# Patient Record
Sex: Female | Born: 1955 | Race: White | Hispanic: No | Marital: Married | Smoking: Never smoker
Health system: Southern US, Community
[De-identification: ages and names within clinical notes are randomized; demographics above are authoritative.]

## PROBLEM LIST (undated history)

## (undated) DIAGNOSIS — I82409 Acute embolism and thrombosis of unspecified deep veins of unspecified lower extremity: Secondary | ICD-10-CM

## (undated) DIAGNOSIS — M5416 Radiculopathy, lumbar region: Secondary | ICD-10-CM

## (undated) DIAGNOSIS — L4050 Arthropathic psoriasis, unspecified: Secondary | ICD-10-CM

## (undated) DIAGNOSIS — G8929 Other chronic pain: Secondary | ICD-10-CM

## (undated) DIAGNOSIS — M549 Dorsalgia, unspecified: Secondary | ICD-10-CM

## (undated) DIAGNOSIS — E785 Hyperlipidemia, unspecified: Secondary | ICD-10-CM

## (undated) HISTORY — DX: Acute embolism and thrombosis of unspecified deep veins of unspecified lower extremity: I82.409

## (undated) HISTORY — PX: KNEE RECONSTRUCTION: SHX5883

## (undated) HISTORY — PX: TOTAL HIP ARTHROPLASTY: SHX124

## (undated) HISTORY — PX: OTHER SURGICAL HISTORY: SHX169

## (undated) HISTORY — DX: Hyperlipidemia, unspecified: E78.5

---

## 1984-10-27 HISTORY — PX: APPENDECTOMY: SHX54

## 1990-10-27 HISTORY — PX: CHOLECYSTECTOMY: SHX55

## 2000-08-27 ENCOUNTER — Other Ambulatory Visit: Admission: RE | Admit: 2000-08-27 | Discharge: 2000-08-27 | Payer: Self-pay | Admitting: Obstetrics and Gynecology

## 2000-10-06 ENCOUNTER — Encounter (INDEPENDENT_AMBULATORY_CARE_PROVIDER_SITE_OTHER): Payer: Self-pay | Admitting: Specialist

## 2000-10-06 ENCOUNTER — Ambulatory Visit (HOSPITAL_COMMUNITY): Admission: RE | Admit: 2000-10-06 | Discharge: 2000-10-06 | Payer: Self-pay | Admitting: Obstetrics and Gynecology

## 2000-11-19 ENCOUNTER — Encounter (INDEPENDENT_AMBULATORY_CARE_PROVIDER_SITE_OTHER): Payer: Self-pay | Admitting: Specialist

## 2000-11-19 ENCOUNTER — Other Ambulatory Visit: Admission: RE | Admit: 2000-11-19 | Discharge: 2000-11-19 | Payer: Self-pay | Admitting: Internal Medicine

## 2003-05-19 ENCOUNTER — Other Ambulatory Visit: Admission: RE | Admit: 2003-05-19 | Discharge: 2003-05-19 | Payer: Self-pay | Admitting: Obstetrics and Gynecology

## 2003-06-06 ENCOUNTER — Ambulatory Visit (HOSPITAL_COMMUNITY): Admission: RE | Admit: 2003-06-06 | Discharge: 2003-06-06 | Payer: Self-pay | Admitting: Obstetrics and Gynecology

## 2003-06-06 ENCOUNTER — Encounter: Payer: Self-pay | Admitting: Obstetrics and Gynecology

## 2007-01-22 ENCOUNTER — Ambulatory Visit (HOSPITAL_COMMUNITY): Admission: RE | Admit: 2007-01-22 | Discharge: 2007-01-22 | Payer: Self-pay | Admitting: Family Medicine

## 2007-05-05 ENCOUNTER — Encounter: Admission: RE | Admit: 2007-05-05 | Discharge: 2007-05-05 | Payer: Self-pay | Admitting: Family Medicine

## 2008-08-18 ENCOUNTER — Ambulatory Visit (HOSPITAL_COMMUNITY): Admission: RE | Admit: 2008-08-18 | Discharge: 2008-08-18 | Payer: Self-pay | Admitting: Family Medicine

## 2010-11-17 ENCOUNTER — Encounter: Payer: Self-pay | Admitting: Family Medicine

## 2011-03-14 NOTE — Op Note (Signed)
[facility]  Patient:    Shelby Clark, Shelby Clark                         MRN: [id number] Proc. Date: 10/06/00 Attending:  Juluis Mire, M.D.                           Operative Report  PREOPERATIVE DIAGNOSIS:       Endometrial polyp.  POSTOPERATIVE DIAGNOSIS:      Endometrial polyp.  OPERATION:                    1. Hysteroscopic resection of endometrial polyp.                               2. Dilatation and curettage.  SURGEON:                      Juluis Mire, M.D.  ANESTHESIA:                   Laryngeal mask anesthesia.  ESTIMATED BLOOD LOSS:         Normal.  PACKS & DRAINS:               None.  INTRAOPERATIVE BLOOD REPLACEMENT:  None.  COMPLICATIONS:                None.  INDICATIONS:                  In the dictated history and physical examination.  DESCRIPTION OF PROCEDURE:     The patient was taken to the operating room and placed in the supine position.  After a satisfactory level of general anesthesia was obtained, the patient was placed in the dorsal lithotomy position using Allen stirrups.  The abdomen, perineum, and vagina were prepped out with Betadine and draped as a sterile field for a hysteroscopy.  A speculum was placed in the vaginal vault.  The cervix was grasped with a single tooth tenaculum.  The uterus was sounded to approximately 8.0 cm.  The cervix was serially dilated to a size #35 Pratt dilator.  The operating hysteroscope was introduced.  Visualization revealed a large endometrial polyp coming from the fundal area.  This was resected in multiple pieces and sent for pathological review.  Further endometrial biopsies were obtained with the resectoscope.  At the end of the procedure we had good hemostasis.  No active bleeding.  The polyp had been completely transected.  We then obtained endometrial curettings.  A single tooth tenaculum was inspected and then removed.  The patient was taken out of the dorsal lithotomy  position.  Once alert and extubated, she was transferred to the recovery room in good condition.  The sponge, needle, and instrument counts were reported as correct by the circulating nurse x 2. DD:  10/06/00 TD:  10/06/00 Job: [id number]

## 2011-04-02 ENCOUNTER — Other Ambulatory Visit: Payer: Self-pay | Admitting: Family Medicine

## 2011-04-02 DIAGNOSIS — Z1231 Encounter for screening mammogram for malignant neoplasm of breast: Secondary | ICD-10-CM

## 2011-04-10 ENCOUNTER — Ambulatory Visit (HOSPITAL_COMMUNITY): Payer: Self-pay

## 2011-04-15 ENCOUNTER — Ambulatory Visit (HOSPITAL_COMMUNITY)
Admission: RE | Admit: 2011-04-15 | Discharge: 2011-04-15 | Disposition: A | Discharge status: Home / Self Care | Payer: BC Managed Care – PPO | Source: Ambulatory Visit | Attending: Family Medicine | Admitting: Family Medicine

## 2011-04-15 DIAGNOSIS — Z1231 Encounter for screening mammogram for malignant neoplasm of breast: Secondary | ICD-10-CM | POA: Insufficient documentation

## 2012-01-17 ENCOUNTER — Encounter: Payer: Self-pay | Admitting: Family Medicine

## 2012-01-17 ENCOUNTER — Ambulatory Visit: Payer: BC Managed Care – PPO

## 2012-01-17 ENCOUNTER — Ambulatory Visit (INDEPENDENT_AMBULATORY_CARE_PROVIDER_SITE_OTHER): Payer: BC Managed Care – PPO | Admitting: Family Medicine

## 2012-01-17 VITALS — BP 124/73 | HR 74 | Temp 98.2°F | Resp 16 | Ht 71.0 in | Wt 188.6 lb

## 2012-01-17 DIAGNOSIS — R1032 Left lower quadrant pain: Secondary | ICD-10-CM

## 2012-01-17 LAB — POCT CBC
Granulocyte percent: 53.2 %G (ref 37–80)
HCT, POC: 39.5 % (ref 37.7–47.9)
MID (cbc): 0.3 (ref 0–0.9)
MPV: 8.4 fL (ref 0–99.8)
POC Granulocyte: 2.3 (ref 2–6.9)
POC LYMPH PERCENT: 39.5 %L (ref 10–50)
POC MID %: 7.3 %M (ref 0–12)
Platelet Count, POC: 302 10*3/uL (ref 142–424)
RDW, POC: 12.9 %

## 2012-01-17 LAB — POCT URINALYSIS DIPSTICK
Bilirubin, UA: NEGATIVE
Ketones, UA: NEGATIVE
Nitrite, UA: NEGATIVE

## 2012-01-17 LAB — POCT UA - MICROSCOPIC ONLY

## 2012-01-17 MED ORDER — CIPROFLOXACIN HCL 500 MG PO TABS: 500.0000 mg | ORAL_TABLET | Freq: Two times a day (BID) | ORAL | Status: AC

## 2012-01-17 NOTE — Progress Notes (Signed)
  Subjective:    Patient ID: Shelby Clark, female    DOB: 1955/11/08, 56 y.o.   MRN: [id number]  HPI 56 yo female with lower abdominal pain.  Over a week ago had cough and fever and felt weak.  No vomitting or diarrhea.  Tuesday night started to have grabbing LLQ abdominal pain.  Worse with deep breath.  Felt much better with heating pad.  Felt better Thursday until the evening and started to return.  Last night started to grab again.  "in ovary area".  No further fever.  Bowels normal.  No dysuria or urinary frequency.  Comes and goes.  Nothing seems to make it worse.  Heat does make it better.  No unusual activity.  Had colonscopy 3/11   Review of Systems Negative except as per HPI     Objective:   Physical Exam  Constitutional: Vital signs are normal. She appears well-developed and well-nourished. She is active.  Cardiovascular: Normal rate, regular rhythm, normal heart sounds and normal pulses.   Pulmonary/Chest: Effort normal and breath sounds normal.  Abdominal: Soft. Normal appearance and bowel sounds are normal. She exhibits no distension and no mass. There is no hepatosplenomegaly. There is tenderness in the left lower quadrant. There is no rigidity, no rebound, no guarding, no CVA tenderness, no tenderness at McBurney's point and negative Murphy's sign. No hernia.  Neurological: She is alert.    Results for orders placed in visit on 01/17/12  POCT CBC      Component Value Range   WBC 4.4 (*) 4.6 - 10.2 (K/uL)   Lymph, poc 1.7  0.6 - 3.4    POC LYMPH PERCENT 39.5  10 - 50 (%L)   MID (cbc) 0.3  0 - 0.9    POC MID % 7.3  0 - 12 (%M)   POC Granulocyte 2.3  2 - 6.9    Granulocyte percent 53.2  37 - 80 (%G)   RBC 4.44  4.04 - 5.48 (M/uL)   Hemoglobin 12.6  12.2 - 16.2 (g/dL)   HCT, POC 40.9  81.1 - 47.9 (%)   MCV 89.0  80 - 97 (fL)   MCH, POC 28.4  27 - 31.2 (pg)   MCHC 31.9  31.8 - 35.4 (g/dL)   RDW, POC 91.4     Platelet Count, POC 302  142 - 424 (K/uL)   MPV 8.4  0 -  99.8 (fL)  POCT UA - MICROSCOPIC ONLY      Component Value Range   WBC, Ur, HPF, POC 7-18     RBC, urine, microscopic 2-3     Bacteria, U Microscopic 1+     Mucus, UA neg     Epithelial cells, urine per micros 2-6     Crystals, Ur, HPF, POC neg     Casts, Ur, LPF, POC neg     Yeast, UA neg     Moderate LE as well  [facility] Primary radiology reading by Dr. Georgiana Shore: NAD    Assessment & Plan:  LLQ abdominal pain - suspicious urine.  Possible UTI.  Start cipro.  Await culture.  Also schedule pelvic ulrasound for further eval

## 2012-01-18 LAB — URINE CULTURE: Colony Count: 10000

## 2012-01-19 ENCOUNTER — Other Ambulatory Visit: Payer: Self-pay | Admitting: Family Medicine

## 2012-01-19 DIAGNOSIS — R1032 Left lower quadrant pain: Secondary | ICD-10-CM

## 2012-05-25 ENCOUNTER — Other Ambulatory Visit: Payer: Self-pay | Admitting: Family Medicine

## 2012-05-25 DIAGNOSIS — Z139 Encounter for screening, unspecified: Secondary | ICD-10-CM

## 2012-06-03 ENCOUNTER — Ambulatory Visit (HOSPITAL_COMMUNITY)
Admission: RE | Admit: 2012-06-03 | Discharge: 2012-06-03 | Disposition: A | Discharge status: Home / Self Care | Payer: BC Managed Care – PPO | Source: Ambulatory Visit | Attending: Family Medicine | Admitting: Family Medicine

## 2012-06-03 DIAGNOSIS — Z1231 Encounter for screening mammogram for malignant neoplasm of breast: Secondary | ICD-10-CM | POA: Insufficient documentation

## 2012-06-03 DIAGNOSIS — Z139 Encounter for screening, unspecified: Secondary | ICD-10-CM

## 2012-06-08 ENCOUNTER — Ambulatory Visit (HOSPITAL_COMMUNITY): Payer: BC Managed Care – PPO

## 2013-03-28 ENCOUNTER — Encounter: Payer: Self-pay | Admitting: Physician Assistant

## 2013-03-28 ENCOUNTER — Ambulatory Visit (INDEPENDENT_AMBULATORY_CARE_PROVIDER_SITE_OTHER): Payer: BC Managed Care – PPO | Admitting: Physician Assistant

## 2013-03-28 VITALS — BP 140/90 | HR 108 | Temp 98.4°F | Resp 20 | Ht 70.5 in | Wt 186.0 lb

## 2013-03-28 DIAGNOSIS — B9689 Other specified bacterial agents as the cause of diseases classified elsewhere: Secondary | ICD-10-CM

## 2013-03-28 DIAGNOSIS — J988 Other specified respiratory disorders: Secondary | ICD-10-CM

## 2013-03-28 DIAGNOSIS — A499 Bacterial infection, unspecified: Secondary | ICD-10-CM

## 2013-03-28 MED ORDER — AZITHROMYCIN 250 MG PO TABS: ORAL_TABLET | ORAL | Status: DC

## 2013-03-28 MED ORDER — BENZONATATE 200 MG PO CAPS: 200.0000 mg | ORAL_CAPSULE | Freq: Two times a day (BID) | ORAL | Status: DC | PRN

## 2013-03-28 NOTE — Progress Notes (Signed)
   Patient ID: Shelby Clark MRN: [id number], DOB: May 05, 1956, 57 y.o. Date of Encounter: 03/28/2013, 10:50 AM    Chief Complaint:  Chief Complaint  Patient presents with  . congestion, cough, sinus pain, fever     HPI: 57 y.o. year old female reports that she started getting sick 6 days ago. Started with head, nose congetsion, pressure. Now with really bad cough. Had low grade fever. Was in bed all weekend.   Home Meds: See attached medication section for any medications that were entered at today's visit. The computer does not put those onto this list.The following list is a list of meds entered prior to today's visit.   No current outpatient prescriptions on file prior to visit.   No current facility-administered medications on file prior to visit.    Allergies: No Known Allergies    Review of Systems: See HPI for pertinent ROS. All other ROS negative.    Physical Exam: Blood pressure 140/90, pulse 108, temperature 98.4 F (36.9 C), temperature source Oral, resp. rate 20, height 5' 10.5" (1.791 m), weight 186 lb (84.369 kg)., Body mass index is 26.3 kg/(m^2). General: WNWD WF. Appears in no acute distress. HEENT: Normocephalic, atraumatic, eyes without discharge, sclera non-icteric, nares are without discharge. Bilateral auditory canals clear, TM's are without perforation, pearly grey and translucent with reflective cone of light bilaterally. Oral cavity moist, posterior pharynx without exudate, erythema, peritonsillar abscess, or post nasal drip.No sinus tenderness with percussion.  Neck: Supple. No thyromegaly. No lymphadenopathy. Lungs: Clear bilaterally to auscultation without wheezes, rales, or rhonchi. Breathing is unlabored. Heart: Regular rhythm. No murmurs, rubs, or gallops. Msk:  Strength and tone normal for age. Neuro: Alert and oriented X 3. Moves all extremities spontaneously. Gait is normal. CNII-XII grossly in tact. Psych:  Responds to questions appropriately with  a normal affect.     ASSESSMENT AND PLAN:  57 y.o. year old female with  1. Bacterial respiratory infection - azithromycin (ZITHROMAX) 250 MG tablet; Day 1: Take 2 daily.  Days 2-5: Take 1 daily.  Dispense: 6 tablet; Refill: 0 - benzonatate (TESSALON) 200 MG capsule; Take 1 capsule (200 mg total) by mouth 2 (two) times daily as needed for cough.  Dispense: 20 capsule; Refill: 0 F/U if symtoms do not resolve.   Signed, [address] [place], [place], [facility] 03/28/2013 10:50 AM

## 2013-04-04 ENCOUNTER — Telehealth: Payer: Self-pay | Admitting: Family Medicine

## 2013-04-04 MED ORDER — LEVOFLOXACIN 750 MG PO TABS: 750.0000 mg | ORAL_TABLET | Freq: Every day | ORAL | Status: DC

## 2013-04-04 NOTE — Telephone Encounter (Signed)
rx sent to pharmacy.  Left mess at pt home

## 2013-04-04 NOTE — Telephone Encounter (Signed)
Tell her we will treat with stronger antibiotic. If symptoms do not resolve after this, then she needs to come back for another OV eval. Rx: Levaquin 750mg  one po QD x 7 days.  # 7 /0 .

## 2013-06-08 ENCOUNTER — Encounter: Payer: Self-pay | Admitting: Family Medicine

## 2013-06-08 DIAGNOSIS — E785 Hyperlipidemia, unspecified: Secondary | ICD-10-CM | POA: Insufficient documentation

## 2013-06-09 ENCOUNTER — Other Ambulatory Visit: Payer: Self-pay | Admitting: Physician Assistant

## 2013-06-09 ENCOUNTER — Encounter: Payer: Self-pay | Admitting: Physician Assistant

## 2013-06-09 ENCOUNTER — Ambulatory Visit (INDEPENDENT_AMBULATORY_CARE_PROVIDER_SITE_OTHER): Payer: BC Managed Care – PPO | Admitting: Physician Assistant

## 2013-06-09 VITALS — BP 114/80 | HR 68 | Temp 98.4°F | Resp 18 | Ht 70.0 in | Wt 187.0 lb

## 2013-06-09 DIAGNOSIS — N760 Acute vaginitis: Secondary | ICD-10-CM

## 2013-06-09 DIAGNOSIS — B9689 Other specified bacterial agents as the cause of diseases classified elsewhere: Secondary | ICD-10-CM

## 2013-06-09 DIAGNOSIS — E785 Hyperlipidemia, unspecified: Secondary | ICD-10-CM

## 2013-06-09 DIAGNOSIS — A499 Bacterial infection, unspecified: Secondary | ICD-10-CM

## 2013-06-09 LAB — WET PREP FOR TRICH, YEAST, CLUE: Yeast Wet Prep HPF POC: NONE SEEN

## 2013-06-09 MED ORDER — METRONIDAZOLE 500 MG PO TABS: 500.0000 mg | ORAL_TABLET | Freq: Two times a day (BID) | ORAL | Status: DC

## 2013-06-09 NOTE — Progress Notes (Signed)
   Patient ID: Shelby Clark MRN: [id number], DOB: 1955/12/02, 57 y.o. Date of Encounter: 06/09/2013, 1:28 PM    Chief Complaint:  Chief Complaint  Patient presents with  . Vaginitis     HPI: 57 y.o. year old white female presents with complaints of vaginal odor. She has not noticed any on discharge or itching or irritation. She is married in a monogamous relationship.     Home Meds: See attached medication section for any medications that were entered at today's visit. The computer does not put those onto this list.The following list is a list of meds entered prior to today's visit.   No current outpatient prescriptions on file prior to visit.   No current facility-administered medications on file prior to visit.    Allergies: No Known Allergies    Review of Systems: See HPI for pertinent ROS. All other ROS negative.    Physical Exam: Blood pressure 114/80, pulse 68, temperature 98.4 F (36.9 C), temperature source Oral, resp. rate 18, height 5\' 10"  (1.778 m), weight 187 lb (84.823 kg)., Body mass index is 26.83 kg/(m^2). General: Well-nourished well-developed white female. Appears in no acute distress. Lungs: Clear bilaterally to auscultation without wheezes, rales, or rhonchi. Breathing is unlabored. Heart: Regular rhythm. No murmurs, rubs, or gallops. Abdomen: Soft, non-tender, non-distended with normoactive bowel sounds. No hepatomegaly. No rebound/guarding. No obvious abdominal masses. Msk:  Strength and tone normal for age. Pelvic exam external genitalia normal. Vaginal mucosa normal. Cervix normal. There is a very small amount of vaginal discharge present. Bimanual exam is normal with no cervical motion tenderness no mass no tenderness. Neuro: Alert and oriented X 3. Moves all extremities spontaneously. Gait is normal. CNII-XII grossly in tact. Psych:  Responds to questions appropriately with a normal affect.    Results for orders placed in visit on 06/09/13  WET  PREP FOR TRICH, YEAST, CLUE      Result Value Range   Yeast Wet Prep HPF POC NONE SEEN  NONE SEEN   Trich, Wet Prep NONE SEEN  NONE SEEN   Clue Cells Wet Prep HPF POC FEW (*) NONE SEEN   WBC, Wet Prep HPF POC FEW  NONE SEEN     ASSESSMENT AND PLAN:  57 y.o. year old female with  1. Bacterial vaginosis Patient states that she has no history of BV in the past. I discussed the cause of BV. She does not sit in bath tub and does not douche. - metroNIDAZOLE (FLAGYL) 500 MG tablet; Take 1 tablet (500 mg total) by mouth 2 (two) times daily.  Dispense: 14 tablet; Refill: 0  2. Vaginitis and vulvovaginitis - WET PREP FOR TRICH, YEAST, CLUE - GC/chlamydia probe amp, genital    Signed, [address] [place], [place], [facility] 06/09/2013 1:28 PM

## 2013-06-10 LAB — GC/CHLAMYDIA PROBE AMP: CT Probe RNA: NEGATIVE

## 2014-06-28 ENCOUNTER — Other Ambulatory Visit: Payer: Self-pay | Admitting: Chiropractic Medicine

## 2014-06-28 DIAGNOSIS — M25562 Pain in left knee: Secondary | ICD-10-CM

## 2014-07-06 ENCOUNTER — Other Ambulatory Visit: Payer: BC Managed Care – PPO

## 2014-10-25 ENCOUNTER — Encounter: Payer: Self-pay | Admitting: Family Medicine

## 2014-10-25 ENCOUNTER — Ambulatory Visit (INDEPENDENT_AMBULATORY_CARE_PROVIDER_SITE_OTHER): Payer: BC Managed Care – PPO | Admitting: Family Medicine

## 2014-10-25 VITALS — BP 110/58 | HR 64 | Temp 98.1°F | Resp 16 | Ht 70.0 in | Wt 197.0 lb

## 2014-10-25 DIAGNOSIS — L820 Inflamed seborrheic keratosis: Secondary | ICD-10-CM

## 2014-10-25 DIAGNOSIS — J309 Allergic rhinitis, unspecified: Secondary | ICD-10-CM

## 2014-10-25 MED ORDER — FLUTICASONE PROPIONATE 50 MCG/ACT NA SUSP: 2.0000 | Freq: Every day | NASAL | Status: DC

## 2014-10-25 MED ORDER — DESLORATADINE 5 MG PO TABS: 5.0000 mg | ORAL_TABLET | Freq: Every day | ORAL | Status: DC

## 2014-10-25 NOTE — Addendum Note (Signed)
Addended by: Milinda AntisURHAM, KAWANTA F on: 10/25/2014 04:41 PM   Modules accepted: Orders

## 2014-10-25 NOTE — Progress Notes (Signed)
Patient ID: Shelby Clark, female   DOB: [birth year], 58 y.o.   MRN: [id number]   Subjective:    Patient ID: Shelby LiasSylvia C Clark, female    DOB: [birth year], 58 y.o.   MRN: [id number]  Patient presents for Illness and Mole  here with episodes of sinus drainage postnasal drip and congestion for the past few months. She's tried an over-the-counter allergy medicine with minimal improvement. Occasionally when she rolls to the left side she gets a dizzy sensation only last a couple of seconds and then resolves. She does not take any medication besides her hormone pills on a regular basis. She's not had any cough sore throat fever. Her main concern today is an irritated mole under her left bra strap she noticed that it has grown in size and that it is itching she did peel off part of it and it has regrown. She has had some other moles removed due to history of sun damage but they were benign.    Review Of Systems:  GEN- denies fatigue, fever, weight loss,weakness, recent illness HEENT- denies eye drainage, change in vision, nasal discharge, CVS- denies chest pain, palpitations RESP- denies SOB, cough, wheeze ABD- denies N/V, change in stools, abd pain GU- denies dysuria, hematuria, dribbling, incontinence MSK- denies joint pain, muscle aches, injury Neuro- denies headache, dizziness, syncope, seizure activity       Objective:    BP 110/58 mmHg  Pulse 64  Temp(Src) 98.1 F (36.7 C) (Oral)  Resp 16  Ht 5\' 10"  (1.778 m)  Wt 197 lb (89.359 kg)  BMI 28.27 kg/m2 GEN- NAD, alert and oriented x3 HEENT- PERRL, EOMI, non injected sclera, pink conjunctiva, MMM, oropharynx -post nasal drip, enlarged turbinates, clear rhinorrhea, no maxillary sinus tenderness, TM clear no effusion Neck- Supple, no LAD CVS- RRR, no murmur RESP-CTAB Skin- left lower thoracic beneath bra strap- inflammed keratosis hyperpigmentation on right half  Procedure- Shave biopsy Procedure explained to patient questions answered  benefits and risks discussed verbal consent obtained. Antiseptic-Betadine Anesthesia-lidocaine 1% with Epi Lesion removed left lower thoraic with razor blade Minimal blood loss, drysol touched to lesion for hemostasis Patient tolerated procedure well Bandage applied         Assessment & Plan:      Problem List Items Addressed This Visit    None    Visit Diagnoses    Keratosis, inflamed seborrheic    -  Primary    s/p removal send for pathology    Allergic rhinitis, unspecified allergic rhinitis type        trial of clarinex and flonase as needed, no sign of infection, note hyperliidemia seen in chart, pt states she gets this checked with GYN. Declines FLu shot       Note: This dictation was prepared with Dragon dictation along with smaller phrase technology. Any transcriptional errors that result from this process are unintentional.

## 2014-10-25 NOTE — Patient Instructions (Signed)
Start allergy medication as prescribed We will call with biopsy results F/U as needed, I recommend you return for cholesterol check  Biopsy Care After These instructions give you information on caring for yourself after your procedure. Your doctor may also give you more specific instructions. Call your doctor if you have any problems or questions after your procedure. HOME CARE   Return to your normal diet and activities as told by your doctor.  Change your bandages (dressings) as told by your doctor. If skin glue (adhesive) was used, it will peel off in 7 days.  Only take medicines as told by your doctor.  Ask your doctor when you can bathe and get your wound wet. GET HELP RIGHT AWAY IF:  You see more than a small spot of blood coming from the wound.  You have redness, puffiness (swelling), or pain.  You see yellowish-white fluid (pus) coming from the wound.  You have a fever.  You notice a bad smell coming from the wound or bandage.  You have a rash, trouble breathing, or any allergy problems. MAKE SURE YOU:   Understand these instructions.  Will watch your condition.  Will get help right away if you are not doing well or get worse. Document Released: 06/17/2011 Document Revised: 01/05/2012 Document Reviewed: 06/17/2011 [facility] Patient Information 2015 [employer] [place]. This information is not intended to replace advice given to you by your health care provider. Make sure you discuss any questions you have with your health care provider.

## 2014-10-30 LAB — PATHOLOGY

## 2014-11-01 ENCOUNTER — Encounter: Payer: Self-pay | Admitting: *Deleted

## 2015-04-13 ENCOUNTER — Ambulatory Visit (INDEPENDENT_AMBULATORY_CARE_PROVIDER_SITE_OTHER): Payer: BC Managed Care – PPO | Admitting: Family Medicine

## 2015-04-13 ENCOUNTER — Ambulatory Visit (HOSPITAL_COMMUNITY)
Admission: RE | Admit: 2015-04-13 | Discharge: 2015-04-13 | Disposition: A | Discharge status: Home / Self Care | Payer: BC Managed Care – PPO | Source: Ambulatory Visit | Attending: Family Medicine | Admitting: Family Medicine

## 2015-04-13 ENCOUNTER — Other Ambulatory Visit: Payer: Self-pay | Admitting: *Deleted

## 2015-04-13 ENCOUNTER — Encounter: Payer: Self-pay | Admitting: Family Medicine

## 2015-04-13 VITALS — BP 128/68 | HR 76 | Temp 98.9°F | Resp 14 | Ht 70.0 in | Wt 196.0 lb

## 2015-04-13 DIAGNOSIS — M79671 Pain in right foot: Secondary | ICD-10-CM

## 2015-04-13 DIAGNOSIS — B070 Plantar wart: Secondary | ICD-10-CM

## 2015-04-13 DIAGNOSIS — S99921D Unspecified injury of right foot, subsequent encounter: Secondary | ICD-10-CM

## 2015-04-13 DIAGNOSIS — Y9368 Activity, volleyball (beach) (court): Secondary | ICD-10-CM | POA: Diagnosis not present

## 2015-04-13 DIAGNOSIS — S99921A Unspecified injury of right foot, initial encounter: Secondary | ICD-10-CM | POA: Diagnosis not present

## 2015-04-13 NOTE — Progress Notes (Signed)
Patient ID: Shelby Clark, female   DOB: 03-22-1956, 59 y.o.   MRN: [id number]   Subjective:    Patient ID: Shelby Clark, female    DOB: February 09, 1956, 58 y.o.   MRN: [id number]  Patient presents for R Foot Pain  patient here with right foot pain. She was playing volleyball barefooted and was coming down hard on her foot for the past 2 weeks she has had some soreness on the bottom of her foot. She is concerned about fracture. She has not had any significant bruising but has had swelling and some point tenderness on her foot. She also noticed a plantar wart there about a week ago that she would like to have frozen off. She denies any ankle pain or knee pain.    Review Of Systems:  GEN- denies fatigue, fever, weight loss,weakness, recent illness HEENT- denies eye drainage, change in vision, nasal discharge, CVS- denies chest pain, palpitations RESP- denies SOB, cough, wheeze ABD- denies N/V, change in stools, abd pain GU- denies dysuria, hematuria, dribbling, incontinence MSK- + joint pain, muscle aches, injury Neuro- denies headache, dizziness, syncope, seizure activity       Objective:    BP 128/68 mmHg  Pulse 76  Temp(Src) 98.9 F (37.2 C) (Oral)  Resp 14  Ht 5\' 10"  (1.778 m)  Wt 196 lb (88.905 kg)  BMI 28.12 kg/m2 GEN- NAD, alert and oriented x3 MSK- Right foot- bunion of great toe, FROM ankle, neg sqeeze test, sole of foot/lateral edge- TTP with swelling approx 2-3cm area, no bony abnormality Skin- small plantar wart beneath area of swelling EXT- No edema Pulses- DP- 2+  Cryotherapy - Plantar wart  Procedure reviewed, questions anwered verbal consent given Cryotherapy 5 sec x 3 passes Pt tolerated procedure well      Assessment & Plan:      Problem List Items Addressed This Visit    None    Visit Diagnoses    Plantar wart    -  Primary    Cryotherapy done    Foot injury, right, subsequent encounter        Xray- no acute fracture, chronic bunion, no stress  fracture, / deep bruising with teh swelling,place in walking boot- pt has one at home x 1 week, NSAIDS, elevate foot       Note: This dictation was prepared with Dragon dictation along with smaller phrase technology. Any transcriptional errors that result from this process are unintentional.

## 2015-04-13 NOTE — Patient Instructions (Signed)
I will call with lab results  Wear walking boot  F/U as needed

## 2015-04-13 NOTE — Addendum Note (Signed)
Addended by: Milinda Antis F on: 04/13/2015 05:35 PM   Modules accepted: Level of Service

## 2015-11-26 ENCOUNTER — Encounter: Payer: Self-pay | Admitting: Family Medicine

## 2015-11-26 ENCOUNTER — Ambulatory Visit (INDEPENDENT_AMBULATORY_CARE_PROVIDER_SITE_OTHER): Payer: BC Managed Care – PPO | Admitting: Family Medicine

## 2015-11-26 VITALS — BP 126/84 | HR 78 | Temp 99.0°F | Resp 16 | Wt 190.0 lb

## 2015-11-26 DIAGNOSIS — J111 Influenza due to unidentified influenza virus with other respiratory manifestations: Secondary | ICD-10-CM

## 2015-11-26 NOTE — Progress Notes (Signed)
   Subjective:    Patient ID: Shelby Clark, female    DOB: 1956-02-15, 60 y.o.   MRN: [id number]  HPI Symptomsbegan last week on Tuesday. Symptoms consist of high fever, diffuse body aches, rhinorrhea, sore throat secondary to postnasal drip, and a persistent cough. Symptoms are approximately 50% better. She is no longer running a fever. She would like to return to work. However she needs a note for work explaining her absence. Past Medical History  Diagnosis Date  . Hyperlipidemia    Past Surgical History  Procedure Laterality Date  . Abdominal laparoscopy    . Appendectomy  1986  . Cholecystectomy  1992   Current Outpatient Prescriptions on File Prior to Visit  Medication Sig Dispense Refill  . desloratadine (CLARINEX) 5 MG tablet Take 1 tablet (5 mg total) by mouth daily. 30 tablet 6  . Estradiol (ESTRACE VA) Place vaginally.    . fluticasone (FLONASE) 50 MCG/ACT nasal spray Place 2 sprays into both nostrils daily. 16 g 6   No current facility-administered medications on file prior to visit.   No Known Allergies Social History   Social History  . Marital Status: Married    Spouse Name: N/A  . Number of Children: N/A  . Years of Education: N/A   Occupational History  . Not on file.   Social History Main Topics  . Smoking status: Never Smoker   . Smokeless tobacco: Never Used  . Alcohol Use: No  . Drug Use: No  . Sexual Activity: Not on file   Other Topics Concern  . Not on file   Social History Narrative      Review of Systems  All other systems reviewed and are negative.      Objective:   Physical Exam  Constitutional: She appears well-developed and well-nourished.  HENT:  Right Ear: External ear normal.  Left Ear: External ear normal.  Nose: Nose normal.  Mouth/Throat: Oropharynx is clear and moist. No oropharyngeal exudate.  Eyes: Conjunctivae are normal.  Neck: Neck supple.  Cardiovascular: Normal rate, regular rhythm and normal heart sounds.     Pulmonary/Chest: Effort normal and breath sounds normal. No respiratory distress. She has no wheezes. She has no rales.  Abdominal: Soft. Bowel sounds are normal. She exhibits no distension. There is no tenderness. There is no rebound.  Lymphadenopathy:    She has no cervical adenopathy.  Vitals reviewed.         Assessment & Plan:  Influenza with respiratory manifestation  Clinically, the patient had influenza. Symptoms have persisted now for 7 days. I believe she can go back to work on Wednesday. I will continue to treat her symptoms symptomatically with Tylenol for fever, Mucinex DM for cough. She can use Hycodan 1 teaspoon every 8 hours as needed for severe cough. She is cleared to return to work on Wednesday she should no longer be contagious.

## 2015-11-27 ENCOUNTER — Encounter: Payer: Self-pay | Admitting: Family Medicine

## 2015-11-29 ENCOUNTER — Telehealth: Payer: Self-pay | Admitting: Family Medicine

## 2015-11-29 ENCOUNTER — Other Ambulatory Visit: Payer: Self-pay | Admitting: Family Medicine

## 2015-11-29 DIAGNOSIS — R059 Cough, unspecified: Secondary | ICD-10-CM

## 2015-11-29 DIAGNOSIS — R05 Cough: Secondary | ICD-10-CM

## 2015-11-29 MED ORDER — AZITHROMYCIN 250 MG PO TABS: ORAL_TABLET | ORAL | Status: DC

## 2015-11-29 NOTE — Telephone Encounter (Signed)
Pt made aware of antibiotic.  Is going to go to [facility] for CXR.  Orders in

## 2015-11-29 NOTE — Telephone Encounter (Signed)
I am sorry, add zpack and obtain cxr

## 2015-11-29 NOTE — Telephone Encounter (Signed)
Still out of work sick.  Cough has not improved at all.  Hycodan has not helped at all.  Has had no sleep.  Please advise?

## 2015-11-30 ENCOUNTER — Encounter: Payer: Self-pay | Admitting: Family Medicine

## 2015-12-14 ENCOUNTER — Telehealth: Payer: Self-pay | Admitting: Family Medicine

## 2015-12-14 NOTE — Telephone Encounter (Signed)
Ok with note for today but if no better by next week needs to be seen.

## 2015-12-14 NOTE — Telephone Encounter (Signed)
PATIENT CALLING TO SAY THAT SHE HAD GONE BACK TO SCHOOL AFTER BEING OUT WITH THE FLU, SHE HAS NOW RELAPSED WITH HER ILLNESS, WOULD LIKE TO KNOW IF SHE CAN HAVE AN ADDITIONAL NOTE OR TALK TO YOU REGARDING THIS  [phone]  FAX NUMBER IF WE ARE FAXING NOTE IS [phone]

## 2015-12-14 NOTE — Telephone Encounter (Signed)
Pt did not get cxr

## 2015-12-14 NOTE — Telephone Encounter (Signed)
Spoke to pt and is aware if no better NTBS and note faxed as requested

## 2016-07-17 ENCOUNTER — Other Ambulatory Visit: Payer: Self-pay | Admitting: Family Medicine

## 2016-07-17 DIAGNOSIS — Z1231 Encounter for screening mammogram for malignant neoplasm of breast: Secondary | ICD-10-CM

## 2016-07-25 ENCOUNTER — Other Ambulatory Visit: Payer: BC Managed Care – PPO

## 2016-07-25 DIAGNOSIS — Z79890 Hormone replacement therapy (postmenopausal): Secondary | ICD-10-CM

## 2016-07-25 DIAGNOSIS — Z79899 Other long term (current) drug therapy: Secondary | ICD-10-CM

## 2016-07-25 DIAGNOSIS — Z0000 Encounter for general adult medical examination without abnormal findings: Secondary | ICD-10-CM

## 2016-07-25 DIAGNOSIS — E785 Hyperlipidemia, unspecified: Secondary | ICD-10-CM

## 2016-07-25 LAB — COMPLETE METABOLIC PANEL WITH GFR
ALT: 16 U/L (ref 6–29)
AST: 19 U/L (ref 10–35)
Albumin: 4.1 g/dL (ref 3.6–5.1)
Alkaline Phosphatase: 59 U/L (ref 33–130)
BUN: 13 mg/dL (ref 7–25)
CALCIUM: 9.1 mg/dL (ref 8.6–10.4)
CHLORIDE: 107 mmol/L (ref 98–110)
CO2: 31 mmol/L (ref 20–31)
CREATININE: 0.82 mg/dL (ref 0.50–1.05)
GFR, Est African American: >89 mL/min (ref >=60)
GFR, Est Non African American: 79 mL/min (ref >=60)
Glucose, Bld: 87 mg/dL (ref 70–99)
Potassium: 4.6 mmol/L (ref 3.5–5.3)
Sodium: 143 mmol/L (ref 135–146)
TOTAL PROTEIN: 6.7 g/dL (ref 6.1–8.1)
Total Bilirubin: 0.4 mg/dL (ref 0.2–1.2)

## 2016-07-25 LAB — CBC WITH DIFFERENTIAL/PLATELET
BASOS PCT: 1 %
Basophils Absolute: 42 cells/uL (ref 0–200)
EOS PCT: 4 %
Eosinophils Absolute: 168 cells/uL (ref 15–500)
HCT: 41.9 % (ref 35.0–45.0)
Hemoglobin: 14 g/dL (ref 12.0–15.0)
LYMPHS PCT: 29 %
Lymphs Abs: 1218 cells/uL (ref 850–3900)
MCH: 30.2 pg (ref 27.0–33.0)
MCHC: 33.4 g/dL (ref 32.0–36.0)
MCV: 90.5 fL (ref 80.0–100.0)
MONOS PCT: 10 %
MPV: 9.6 fL (ref 7.5–12.5)
Monocytes Absolute: 420 cells/uL (ref 200–950)
Neutro Abs: 2352 cells/uL (ref 1500–7800)
Neutrophils Relative %: 56 %
PLATELETS: 310 10*3/uL (ref 140–400)
RBC: 4.63 MIL/uL (ref 3.80–5.10)
RDW: 13.1 % (ref 11.0–15.0)
WBC: 4.2 10*3/uL (ref 3.8–10.8)

## 2016-07-25 LAB — LIPID PANEL
CHOL/HDL RATIO: 4.6 ratio (ref <=5.0)
Cholesterol: 204 mg/dL — ABNORMAL HIGH (ref 125–200)
HDL: 44 mg/dL — ABNORMAL LOW (ref >=46)
LDL Cholesterol: 138 mg/dL — ABNORMAL HIGH (ref <130)
Triglycerides: 110 mg/dL (ref <150)
VLDL: 22 mg/dL (ref <30)

## 2016-07-25 LAB — TSH: TSH: 1.05 mIU/L

## 2016-07-26 LAB — VITAMIN D 25 HYDROXY (VIT D DEFICIENCY, FRACTURES): Vit D, 25-Hydroxy: 25 ng/mL — ABNORMAL LOW (ref 30–100)

## 2016-07-28 ENCOUNTER — Ambulatory Visit (HOSPITAL_COMMUNITY)
Admission: RE | Admit: 2016-07-28 | Discharge: 2016-07-28 | Disposition: A | Discharge status: Home / Self Care | Payer: BC Managed Care – PPO | Source: Ambulatory Visit | Attending: Family Medicine | Admitting: Family Medicine

## 2016-07-28 DIAGNOSIS — R928 Other abnormal and inconclusive findings on diagnostic imaging of breast: Secondary | ICD-10-CM | POA: Diagnosis not present

## 2016-07-28 DIAGNOSIS — Z1231 Encounter for screening mammogram for malignant neoplasm of breast: Secondary | ICD-10-CM | POA: Diagnosis not present

## 2016-08-04 ENCOUNTER — Other Ambulatory Visit: Payer: Self-pay | Admitting: Family Medicine

## 2016-08-04 DIAGNOSIS — R928 Other abnormal and inconclusive findings on diagnostic imaging of breast: Secondary | ICD-10-CM

## 2016-08-18 ENCOUNTER — Encounter: Payer: Self-pay | Admitting: Family Medicine

## 2016-08-18 ENCOUNTER — Ambulatory Visit (INDEPENDENT_AMBULATORY_CARE_PROVIDER_SITE_OTHER): Payer: BC Managed Care – PPO | Admitting: Family Medicine

## 2016-08-18 VITALS — BP 124/62 | HR 76 | Temp 98.7°F | Resp 14 | Ht 70.0 in | Wt 195.0 lb

## 2016-08-18 DIAGNOSIS — Z124 Encounter for screening for malignant neoplasm of cervix: Secondary | ICD-10-CM | POA: Diagnosis not present

## 2016-08-18 DIAGNOSIS — Z23 Encounter for immunization: Secondary | ICD-10-CM | POA: Diagnosis not present

## 2016-08-18 DIAGNOSIS — Z0000 Encounter for general adult medical examination without abnormal findings: Secondary | ICD-10-CM

## 2016-08-18 NOTE — Addendum Note (Signed)
Addended by: Phillips OdorSIX, CHRISTINA H on: 08/18/2016 05:02 PM   Modules accepted: Orders

## 2016-08-18 NOTE — Progress Notes (Signed)
   Subjective:    Patient ID: Shelby Clark, female    DOB: [birth year], 60 y.o.   MRN: [id number]  Patient presents for CPE with PAP (is not fasting)  Patient here for complete physical exam. Colonoscopy up-to-date. Mammogram done but she has a benign lesion in her left breast that has been palpated for the past 15 years that she has a follow-up ultrasound to be done tomorrow. She is due for Pap smear she's never had abnormal Pap smear. She is postmenopausal. The past she was given Estrace vaginally to help with itching in odor she's not been on this medication greater than one year.  Immunizations she declines flu shot she is due for tetanus booster.  She is not on any current medications.  No current concerns. She does have history of tonsil stones she is not one anything done with these at this time.   Review Of Systems:  GEN- denies fatigue, fever, weight loss,weakness, recent illness HEENT- denies eye drainage, change in vision, nasal discharge, CVS- denies chest pain, palpitations RESP- denies SOB, cough, wheeze ABD- denies N/V, change in stools, abd pain GU- denies dysuria, hematuria, dribbling, incontinence MSK- denies joint pain, muscle aches, injury Neuro- denies headache, dizziness, syncope, seizure activity       Objective:    BP 124/62 (BP Location: Left Arm, Patient Position: Sitting, Cuff Size: Normal)   Pulse 76   Temp 98.7 F (37.1 C) (Oral)   Resp 14   Ht 5\' 10"  (1.778 m)   Wt 195 lb (88.5 kg)   SpO2 99% Comment: RA  BMI 27.98 kg/m  GEN- NAD, alert and oriented x3 HEENT- PERRL, EOMI, non injected sclera, pink conjunctiva, MMM, oropharynx clear Neck- Supple, no thyromegaly Breast- normal symmetry, no nipple inversion,no nipple drainage, no nodules or lumps felt Nodes- no axillary nodes CVS- RRR, no murmur RESP-CTAB ABD-NABS,soft,NT,ND GU- normal external genitalia, vaginal mucosa pink and moist, cervix visualized no growth atrophic, stenosis at os, no  blood form os, minimal thin clear discharge, no CMT, no ovarian masses, uterus normal size EXT- No edema Pulses- Radial, DP- 2+        Assessment & Plan:      Problem List Items Addressed This Visit    None    Visit Diagnoses    Need for prophylactic vaccination and inoculation against influenza    -  Primary   Routine general medical examination at a health care facility       CPE Done, repeat Mammo tomorrow, TDAP done, reviewed labs, declines Hep C screening she is low risk, has tonsil stones refer to ENT if needed, for atrophic vaginitis not currently using estrogen cream will Hold off for now as she has symptoms       Note: This dictation was prepared with Dragon dictation along with smaller phrase technology. Any transcriptional errors that result from this process are unintentional.

## 2016-08-18 NOTE — Patient Instructions (Signed)
I recommend eye visit once a year I recommend dental visit every 6 months Goal is to  Exercise 30 minutes 5 days a week We will send a letter with lab results  TDAP given today Recommend Vitamin D 1000IU once a day  F/U 1 year for physical

## 2016-08-19 ENCOUNTER — Ambulatory Visit (HOSPITAL_COMMUNITY)
Admission: RE | Admit: 2016-08-19 | Discharge: 2016-08-19 | Disposition: A | Discharge status: Home / Self Care | Payer: BC Managed Care – PPO | Source: Ambulatory Visit | Attending: Family Medicine | Admitting: Family Medicine

## 2016-08-19 DIAGNOSIS — N6489 Other specified disorders of breast: Secondary | ICD-10-CM | POA: Diagnosis not present

## 2016-08-19 DIAGNOSIS — R928 Other abnormal and inconclusive findings on diagnostic imaging of breast: Secondary | ICD-10-CM

## 2016-08-19 LAB — PAP, THIN PREP W/HPV RFLX HPV TYPE 16/18: HPV DNA High Risk: NOT DETECTED

## 2016-08-20 ENCOUNTER — Encounter: Payer: Self-pay | Admitting: *Deleted

## 2016-10-08 ENCOUNTER — Ambulatory Visit: Payer: BC Managed Care – PPO | Admitting: Physician Assistant

## 2016-10-08 ENCOUNTER — Ambulatory Visit (INDEPENDENT_AMBULATORY_CARE_PROVIDER_SITE_OTHER): Payer: BC Managed Care – PPO | Admitting: Physician Assistant

## 2016-10-08 ENCOUNTER — Encounter: Payer: Self-pay | Admitting: Physician Assistant

## 2016-10-08 ENCOUNTER — Encounter: Payer: Self-pay | Admitting: Family Medicine

## 2016-10-08 VITALS — BP 124/80 | HR 84 | Temp 98.6°F | Resp 16 | Wt 186.0 lb

## 2016-10-08 DIAGNOSIS — J988 Other specified respiratory disorders: Secondary | ICD-10-CM

## 2016-10-08 DIAGNOSIS — B9689 Other specified bacterial agents as the cause of diseases classified elsewhere: Principal | ICD-10-CM

## 2016-10-08 MED ORDER — AZITHROMYCIN 250 MG PO TABS: ORAL_TABLET | ORAL | 0 refills | Status: DC

## 2016-10-08 NOTE — Progress Notes (Signed)
    Patient ID: Shelby LiasSylvia C Stimson MRN: [id number], DOB: [birth year], 60 y.o. Date of Encounter: 10/08/2016, 9:25 AM    Chief Complaint:  Chief Complaint  Patient presents with  . Cough  . congested  .      HPI: 60 y.o. year old female presetns with above.   Is that she has had congestion in her head and nose for several weeks. Has used NyQuil some. Has used over-the-counter allergy meds for several days at that time thinking that would maybe help. Yesterday developed cough and more drainage down the throat and the congestion is getting worse as well and her head and nose. Has had no significant sore throat. No fevers or chills.     Home Meds:   Outpatient Medications Prior to Visit  Medication Sig Dispense Refill  . Estradiol (ESTRACE VA) Place vaginally.     No facility-administered medications prior to visit.     Allergies: No Known Allergies    Review of Systems: See HPI for pertinent ROS. All other ROS negative.    Physical Exam: Blood pressure 124/80, pulse 84, temperature 98.6 F (37 C), temperature source Oral, resp. rate 16, weight 186 lb (84.4 kg), SpO2 98 %., Body mass index is 26.69 kg/m. General:  WNWD WF. Appears in no acute distress. HEENT: Normocephalic, atraumatic, eyes without discharge, sclera non-icteric, nares are without discharge. Bilateral auditory canals clear, TM's are without perforation, pearly grey and translucent with reflective cone of light bilaterally. Oral cavity moist, posterior pharynx without exudate, erythema, peritonsillar abscess.  Neck: Supple. No thyromegaly. No lymphadenopathy. Lungs: Clear bilaterally to auscultation without wheezes, rales, or rhonchi. Breathing is unlabored. Heart: Regular rhythm. No murmurs, rubs, or gallops. Msk:  Strength and tone normal for age. Extremities/Skin: Warm and dry.  Neuro: Alert and oriented X 3. Moves all extremities spontaneously. Gait is normal. CNII-XII grossly in tact. Psych:  Responds to  questions appropriately with a normal affect.     ASSESSMENT AND PLAN:  60 y.o. year old female with  1. Bacterial respiratory infection Is to take the antibiotic as directed and complete all of it. I'll up if symptoms do not resolve within 1 week after completion of antibiotic. Note given for out of work today return tomorrow. - azithromycin (ZITHROMAX) 250 MG tablet; Day 1: Take 2 daily. Days 2-5: Take 1 daily.  Dispense: 6 tablet; Refill: 0   Signed, [address] SolvayDixon, [place], [facility] 10/08/2016 9:25 AM

## 2016-10-13 ENCOUNTER — Telehealth: Payer: Self-pay

## 2016-10-13 MED ORDER — CEFDINIR 300 MG PO CAPS: 300.0000 mg | ORAL_CAPSULE | Freq: Two times a day (BID) | ORAL | 0 refills | Status: DC

## 2016-10-13 NOTE — Telephone Encounter (Signed)
Pt called and stated she could not continue taking the azithromycin because she started to develop a rash.  I will doc her chart that she is sensitive to this medication can you pls write another RX.

## 2016-10-13 NOTE — Telephone Encounter (Signed)
Omnicef 300mg  1 pop BID x 10 days # 20 + 0

## 2016-10-13 NOTE — Telephone Encounter (Signed)
Rx filled pt aware req VM be left

## 2016-12-23 ENCOUNTER — Ambulatory Visit (INDEPENDENT_AMBULATORY_CARE_PROVIDER_SITE_OTHER): Payer: BC Managed Care – PPO | Admitting: Family Medicine

## 2016-12-23 ENCOUNTER — Encounter: Payer: Self-pay | Admitting: Family Medicine

## 2016-12-23 VITALS — BP 170/98 | HR 68 | Temp 98.4°F | Resp 16 | Ht 72.0 in | Wt 193.0 lb

## 2016-12-23 DIAGNOSIS — R1903 Right lower quadrant abdominal swelling, mass and lump: Secondary | ICD-10-CM | POA: Diagnosis not present

## 2016-12-23 NOTE — Progress Notes (Signed)
   Subjective:    Patient ID: Shelby Clark, female    DOB: [birth year], 61 y.o.   MRN: [id number]  HPI  Beginning in January, the patient felt a firm mass in the right lower quadrant of her abdomen. I am able to feel this today it is a firm area roughly the size of a marble below her right ribs. Borders are difficult to discern. It is deep within the subcutaneous fat. It is nontender. It appears to be mobile. The patient denies any blood in her stool. She denies any black tarry stool. She denies any fevers chills nausea vomiting or diarrhea Past Medical History:  Diagnosis Date  . Hyperlipidemia    Past Surgical History:  Procedure Laterality Date  . abdominal laparoscopy    . APPENDECTOMY  1986  . CHOLECYSTECTOMY  1992   No current outpatient prescriptions on file prior to visit.   No current facility-administered medications on file prior to visit.    Allergies  Allergen Reactions  . Azithromycin Rash   Social History   Social History  . Marital status: Married    Spouse name: N/A  . Number of children: N/A  . Years of education: N/A   Occupational History  . Not on file.   Social History Main Topics  . Smoking status: Never Smoker  . Smokeless tobacco: Never Used  . Alcohol use No  . Drug use: No  . Sexual activity: Not on file   Other Topics Concern  . Not on file   Social History Narrative  . No narrative on file     Review of Systems  All other systems reviewed and are negative.      Objective:   Physical Exam  Constitutional: She appears well-developed and well-nourished.  Cardiovascular: Normal rate, regular rhythm and normal heart sounds.   Pulmonary/Chest: Effort normal and breath sounds normal. No respiratory distress. She has no wheezes. She has no rales.  Abdominal: Soft. Bowel sounds are normal. She exhibits mass. She exhibits no distension. There is no tenderness. There is no rebound.  Vitals reviewed.         Assessment & Plan:    Abdominal mass, RLQ (right lower quadrant)  Patient's blood pressures very high but she is also very scared. I believe this is an area of fibrosis deep within the subcutaneous fat from her previous laparoscopic surgery. It is near one of the surgical scars for previous surgery. I try to reassure the patient is best I could there is a discernible mass in that area. Therefore we'll proceed with a CAT scan of the abdomen and pelvis to evaluate further.

## 2016-12-31 ENCOUNTER — Other Ambulatory Visit: Payer: Self-pay | Admitting: Family Medicine

## 2016-12-31 DIAGNOSIS — R1903 Right lower quadrant abdominal swelling, mass and lump: Secondary | ICD-10-CM

## 2016-12-31 NOTE — Progress Notes (Signed)
Insurance would not approve CT Abd/Pelvis.  Based on clinical information only approved CT Abd.  New order placed for that

## 2017-01-01 NOTE — Progress Notes (Signed)
Ok, please notify patient and explain the delay is due to her insurance.

## 2017-01-02 ENCOUNTER — Other Ambulatory Visit: Payer: BC Managed Care – PPO

## 2017-08-13 ENCOUNTER — Other Ambulatory Visit: Payer: Self-pay | Admitting: Family Medicine

## 2017-08-13 DIAGNOSIS — E559 Vitamin D deficiency, unspecified: Secondary | ICD-10-CM

## 2017-08-13 DIAGNOSIS — Z0000 Encounter for general adult medical examination without abnormal findings: Secondary | ICD-10-CM

## 2017-08-13 DIAGNOSIS — E785 Hyperlipidemia, unspecified: Secondary | ICD-10-CM

## 2017-08-14 ENCOUNTER — Other Ambulatory Visit: Payer: BC Managed Care – PPO

## 2017-08-14 DIAGNOSIS — Z0000 Encounter for general adult medical examination without abnormal findings: Secondary | ICD-10-CM

## 2017-08-14 DIAGNOSIS — E785 Hyperlipidemia, unspecified: Secondary | ICD-10-CM

## 2017-08-14 DIAGNOSIS — E559 Vitamin D deficiency, unspecified: Secondary | ICD-10-CM

## 2017-08-15 LAB — COMPLETE METABOLIC PANEL WITH GFR
AG RATIO: 1.6 (calc) (ref 1.0–2.5)
ALT: 16 U/L (ref 6–29)
AST: 17 U/L (ref 10–35)
Albumin: 4.1 g/dL (ref 3.6–5.1)
Alkaline phosphatase (APISO): 62 U/L (ref 33–130)
BILIRUBIN TOTAL: 0.7 mg/dL (ref 0.2–1.2)
BUN: 13 mg/dL (ref 7–25)
CHLORIDE: 104 mmol/L (ref 98–110)
CO2: 28 mmol/L (ref 20–32)
Calcium: 9.5 mg/dL (ref 8.6–10.4)
Creat: 0.86 mg/dL (ref 0.50–0.99)
GFR, Est African American: 85 mL/min/{1.73_m2} (ref >=60)
GFR, Est Non African American: 73 mL/min/{1.73_m2} (ref >=60)
Globulin: 2.5 g/dL (calc) (ref 1.9–3.7)
Glucose, Bld: 83 mg/dL (ref 65–99)
POTASSIUM: 4.3 mmol/L (ref 3.5–5.3)
Sodium: 141 mmol/L (ref 135–146)
Total Protein: 6.6 g/dL (ref 6.1–8.1)

## 2017-08-15 LAB — CBC WITH DIFFERENTIAL/PLATELET
BASOS PCT: 0.5 %
Basophils Absolute: 19 cells/uL (ref 0–200)
EOS PCT: 4.5 %
Eosinophils Absolute: 171 cells/uL (ref 15–500)
HEMATOCRIT: 42.9 % (ref 35.0–45.0)
Hemoglobin: 14.1 g/dL (ref 11.7–15.5)
LYMPHS ABS: 1155 {cells}/uL (ref 850–3900)
MCH: 29.5 pg (ref 27.0–33.0)
MCHC: 32.9 g/dL (ref 32.0–36.0)
MCV: 89.7 fL (ref 80.0–100.0)
MPV: 9.8 fL (ref 7.5–12.5)
Monocytes Relative: 11.8 %
Neutro Abs: 2006 cells/uL (ref 1500–7800)
Neutrophils Relative %: 52.8 %
PLATELETS: 318 10*3/uL (ref 140–400)
RBC: 4.78 10*6/uL (ref 3.80–5.10)
RDW: 11.8 % (ref 11.0–15.0)
Total Lymphocyte: 30.4 %
WBC mixed population: 448 cells/uL (ref 200–950)
WBC: 3.8 10*3/uL (ref 3.8–10.8)

## 2017-08-15 LAB — LIPID PANEL
Cholesterol: 210 mg/dL — ABNORMAL HIGH (ref <200)
HDL: 52 mg/dL (ref >50)
LDL CHOLESTEROL (CALC): 134 mg/dL — AB
NON-HDL CHOLESTEROL (CALC): 158 mg/dL — AB (ref <130)
TRIGLYCERIDES: 127 mg/dL (ref <150)
Total CHOL/HDL Ratio: 4 (calc) (ref <5.0)

## 2017-08-15 LAB — VITAMIN D 25 HYDROXY (VIT D DEFICIENCY, FRACTURES): Vit D, 25-Hydroxy: 27 ng/mL — ABNORMAL LOW (ref 30–100)

## 2017-08-20 ENCOUNTER — Encounter: Payer: Self-pay | Admitting: Family Medicine

## 2017-08-20 ENCOUNTER — Ambulatory Visit (INDEPENDENT_AMBULATORY_CARE_PROVIDER_SITE_OTHER): Payer: BC Managed Care – PPO | Admitting: Family Medicine

## 2017-08-20 ENCOUNTER — Other Ambulatory Visit: Payer: Self-pay | Admitting: Family Medicine

## 2017-08-20 ENCOUNTER — Ambulatory Visit (HOSPITAL_COMMUNITY)
Admission: RE | Admit: 2017-08-20 | Discharge: 2017-08-20 | Disposition: A | Discharge status: Home / Self Care | Payer: BC Managed Care – PPO | Source: Ambulatory Visit | Attending: Family Medicine | Admitting: Family Medicine

## 2017-08-20 VITALS — BP 134/82 | HR 72 | Temp 98.7°F | Resp 16 | Ht 72.0 in | Wt 195.0 lb

## 2017-08-20 DIAGNOSIS — Z0000 Encounter for general adult medical examination without abnormal findings: Secondary | ICD-10-CM

## 2017-08-20 DIAGNOSIS — M5136 Other intervertebral disc degeneration, lumbar region: Secondary | ICD-10-CM | POA: Diagnosis not present

## 2017-08-20 DIAGNOSIS — Z1239 Encounter for other screening for malignant neoplasm of breast: Secondary | ICD-10-CM

## 2017-08-20 DIAGNOSIS — Z1231 Encounter for screening mammogram for malignant neoplasm of breast: Secondary | ICD-10-CM

## 2017-08-20 DIAGNOSIS — M5431 Sciatica, right side: Secondary | ICD-10-CM | POA: Insufficient documentation

## 2017-08-20 NOTE — Progress Notes (Signed)
Subjective:    Patient ID: Shelby Clark, female    DOB: 11/09/1955, 61 y.o.   MRN: [id number]  HPI Patient is here today for a complete physical exam.  Her last mammogram was performed in October 2017 and is due again.  Her last Pap smear was also last year and is not due again for 2 years.  Her last colonoscopy was performed in 2012 and is not due again until 2022.  She is due today for her shingles vaccine.  Her flu shot is up-to-date.  She also reports pain in her lower back radiating into her right gluteus radiating down her right leg and across her anterior right thigh.  She also has chronic low back pain. Going on for several weeks.   Past Medical History:  Diagnosis Date  . Hyperlipidemia    Past Surgical History:  Procedure Laterality Date  . abdominal laparoscopy    . APPENDECTOMY  1986  . CHOLECYSTECTOMY  1992   No current outpatient prescriptions on file prior to visit.   No current facility-administered medications on file prior to visit.    Allergies  Allergen Reactions  . Azithromycin Rash   Social History   Social History  . Marital status: Married    Spouse name: N/A  . Number of children: N/A  . Years of education: N/A   Occupational History  . Not on file.   Social History Main Topics  . Smoking status: Never Smoker  . Smokeless tobacco: Never Used  . Alcohol use No  . Drug use: No  . Sexual activity: Not on file   Other Topics Concern  . Not on file   Social History Narrative  . No narrative on file   Family History  Problem Relation Age of Onset  . Heart disease Father   . Heart disease Brother   . Cancer Brother   . Heart disease Brother       Review of Systems  All other systems reviewed and are negative.      Objective:   Physical Exam  Constitutional: She is oriented to person, place, and time. She appears well-developed and well-nourished. No distress.  HENT:  Head: Normocephalic and atraumatic.  Right Ear: External ear  normal.  Left Ear: External ear normal.  Nose: Nose normal.  Mouth/Throat: Oropharynx is clear and moist. No oropharyngeal exudate.  Eyes: Pupils are equal, round, and reactive to light. Conjunctivae and EOM are normal. Right eye exhibits no discharge. Left eye exhibits no discharge. No scleral icterus.  Neck: Normal range of motion. Neck supple. No JVD present. No tracheal deviation present. No thyromegaly present.  Cardiovascular: Normal rate, regular rhythm, normal heart sounds and intact distal pulses.  Exam reveals no gallop and no friction rub.   No murmur heard. Pulmonary/Chest: Effort normal and breath sounds normal. No stridor. No respiratory distress. She has no wheezes. She has no rales. She exhibits no tenderness.  Abdominal: Soft. Bowel sounds are normal. She exhibits no distension and no mass. There is no tenderness. There is no rebound and no guarding.  Musculoskeletal: Normal range of motion. She exhibits no edema, tenderness or deformity.  Lymphadenopathy:    She has no cervical adenopathy.  Neurological: She is alert and oriented to person, place, and time. She has normal reflexes. She displays normal reflexes. No cranial nerve deficit. She exhibits normal muscle tone. Coordination normal.  Skin: Skin is warm. No rash noted. She is not diaphoretic. No erythema. No pallor.  Psychiatric: She has a normal mood and affect. Her behavior is normal. Judgment and thought content normal.  Vitals reviewed.         Assessment & Plan:  Right sided sciatica - Plan: DG Lumbar Spine Complete  Routine general medical examination at a health care facility  Physical exam today is completely normal.  Most recent lab work as listed below: Appointment on 08/14/2017  Component Date Value Ref Range Status  . Vit D, 25-Hydroxy 08/14/2017 27* 30 - 100 ng/mL Final   Comment: Vitamin D Status         25-OH Vitamin D: . Deficiency:                    <20 ng/mL Insufficiency:             20 -  29 ng/mL Optimal:                 > or = 30 ng/mL . For 25-OH Vitamin D testing on patients on  D2-supplementation and patients for whom quantitation  of D2 and D3 fractions is required, the QuestAssureD(TM) 25-OH VIT D, (D2,D3), LC/MS/MS is recommended: order  code 16109 (patients >64yrs). . For more information on this test, go to: http://education.questdiagnostics.com/faq/FAQ163 (This link is being provided for  informational/educational purposes only.)   . WBC 08/14/2017 3.8  3.8 - 10.8 Thousand/uL Final  . RBC 08/14/2017 4.78  3.80 - 5.10 Million/uL Final  . Hemoglobin 08/14/2017 14.1  11.7 - 15.5 g/dL Final  . HCT 60/45/4098 42.9  35.0 - 45.0 % Final  . MCV 08/14/2017 89.7  80.0 - 100.0 fL Final  . MCH 08/14/2017 29.5  27.0 - 33.0 pg Final  . MCHC 08/14/2017 32.9  32.0 - 36.0 g/dL Final  . RDW 11/91/4782 11.8  11.0 - 15.0 % Final  . Platelets 08/14/2017 318  140 - 400 Thousand/uL Final  . MPV 08/14/2017 9.8  7.5 - 12.5 fL Final  . Neutro Abs 08/14/2017 2006  1,500 - 7,800 cells/uL Final  . Lymphs Abs 08/14/2017 1155  850 - 3,900 cells/uL Final  . WBC mixed population 08/14/2017 448  200 - 950 cells/uL Final  . Eosinophils Absolute 08/14/2017 171  15 - 500 cells/uL Final  . Basophils Absolute 08/14/2017 19  0 - 200 cells/uL Final  . Neutrophils Relative % 08/14/2017 52.8  % Final  . Total Lymphocyte 08/14/2017 30.4  % Final  . Monocytes Relative 08/14/2017 11.8  % Final  . Eosinophils Relative 08/14/2017 4.5  % Final  . Basophils Relative 08/14/2017 0.5  % Final  . Cholesterol 08/14/2017 210* <200 mg/dL Final  . HDL 95/62/1308 52  >50 mg/dL Final  . Triglycerides 08/14/2017 127  <150 mg/dL Final  . LDL Cholesterol (Calc) 08/14/2017 134* mg/dL (calc) Final   Comment: Reference range: <100 . Desirable range <100 mg/dL for primary prevention;   <70 mg/dL for patients with CHD or diabetic patients  with > or = 2 CHD risk factors. Marland Kitchen LDL-C is now calculated using the  Martin-Hopkins  calculation, which is a validated novel method providing  better accuracy than the Friedewald equation in the  estimation of LDL-C.  Horald Pollen et al. Lenox Ahr. 6578;469(62): 2061-2068  (http://education.[facility].com/faq/FAQ164)   . Total CHOL/HDL Ratio 08/14/2017 4.0  <9.5 (calc) Final  . Non-HDL Cholesterol (Calc) 08/14/2017 158* <130 mg/dL (calc) Final   Comment: For patients with diabetes plus 1 major ASCVD risk  factor, treating to a non-HDL-C goal of <100  mg/dL  (LDL-C of <40 mg/dL) is considered a therapeutic  option.   . Glucose, Bld 08/14/2017 83  65 - 99 mg/dL Final   Comment: .            Fasting reference interval .   . BUN 08/14/2017 13  7 - 25 mg/dL Final  . Creat 98/08/9146 0.86  0.50 - 0.99 mg/dL Final   Comment: For patients >23 years of age, the reference limit for Creatinine is approximately 13% higher for people identified as African-American. .   . GFR, Est Non African American 08/14/2017 73  > OR = 60 mL/min/1.105m2 Final  . GFR, Est African American 08/14/2017 85  > OR = 60 mL/min/1.34m2 Final  . BUN/Creatinine Ratio 08/14/2017 NOT APPLICABLE  6 - 22 (calc) Final  . Sodium 08/14/2017 141  135 - 146 mmol/L Final  . Potassium 08/14/2017 4.3  3.5 - 5.3 mmol/L Final  . Chloride 08/14/2017 104  98 - 110 mmol/L Final  . CO2 08/14/2017 28  20 - 32 mmol/L Final  . Calcium 08/14/2017 9.5  8.6 - 10.4 mg/dL Final  . Total Protein 08/14/2017 6.6  6.1 - 8.1 g/dL Final  . Albumin 82/95/6213 4.1  3.6 - 5.1 g/dL Final  . Globulin 08/65/7846 2.5  1.9 - 3.7 g/dL (calc) Final  . AG Ratio 08/14/2017 1.6  1.0 - 2.5 (calc) Final  . Total Bilirubin 08/14/2017 0.7  0.2 - 1.2 mg/dL Final  . Alkaline phosphatase (APISO) 08/14/2017 62  33 - 130 U/L Final  . AST 08/14/2017 17  10 - 35 U/L Final  . ALT 08/14/2017 16  6 - 29 U/L Final   Lab work is excellent.  I calculated her 10-year risk of coronary artery disease to be 3.5% and therefore she does not require  statin therapy.  Flu shot is up-to-date.  I recommended the shingles vaccine.  I recommended we schedule her for a mammogram.  Colonoscopy and Pap smear are up-to-date.  I recommended 2000 units a day of vitamin D.  I would like to obtain a lumbar spine x-ray to evaluate for possible causes of her sciatica.

## 2017-08-31 ENCOUNTER — Ambulatory Visit (HOSPITAL_COMMUNITY)
Admission: RE | Admit: 2017-08-31 | Discharge: 2017-08-31 | Disposition: A | Discharge status: Home / Self Care | Payer: BC Managed Care – PPO | Source: Ambulatory Visit | Attending: Family Medicine | Admitting: Family Medicine

## 2017-08-31 DIAGNOSIS — Z1231 Encounter for screening mammogram for malignant neoplasm of breast: Secondary | ICD-10-CM | POA: Insufficient documentation

## 2017-09-01 ENCOUNTER — Other Ambulatory Visit: Payer: Self-pay | Admitting: Family Medicine

## 2017-09-01 DIAGNOSIS — N6310 Unspecified lump in the right breast, unspecified quadrant: Secondary | ICD-10-CM

## 2017-09-02 ENCOUNTER — Other Ambulatory Visit: Payer: Self-pay | Admitting: Obstetrics and Gynecology

## 2017-09-02 DIAGNOSIS — N6320 Unspecified lump in the left breast, unspecified quadrant: Secondary | ICD-10-CM

## 2017-09-08 ENCOUNTER — Ambulatory Visit (HOSPITAL_COMMUNITY)
Admission: RE | Admit: 2017-09-08 | Discharge: 2017-09-08 | Disposition: A | Discharge status: Home / Self Care | Payer: BC Managed Care – PPO | Source: Ambulatory Visit | Attending: Family Medicine | Admitting: Family Medicine

## 2017-09-08 DIAGNOSIS — N6313 Unspecified lump in the right breast, lower outer quadrant: Secondary | ICD-10-CM | POA: Insufficient documentation

## 2017-09-08 DIAGNOSIS — N6310 Unspecified lump in the right breast, unspecified quadrant: Secondary | ICD-10-CM

## 2017-11-02 ENCOUNTER — Telehealth: Payer: Self-pay | Admitting: Family Medicine

## 2017-11-02 NOTE — Telephone Encounter (Signed)
Pt called [facility] and states that she discussed with you her sciatica at her LOV and she has done acupuncture and lots of stretching exercises and she is still having pain and would like to know if she could get a cortisone injection in it if that would help her? She states that you had mentioned PT and she would not like to do PT.

## 2017-11-02 NOTE — Telephone Encounter (Signed)
Would need mri of lumbar spine to determine where to due ESI.  We need to order/schedule.

## 2017-11-03 NOTE — Telephone Encounter (Signed)
[facility] recommended MRI and to call back if wanted to schedule.

## 2017-11-05 ENCOUNTER — Telehealth: Payer: Self-pay | Admitting: Family Medicine

## 2017-11-05 NOTE — Telephone Encounter (Signed)
Patient calling to get results of imaging recently done of back  [phone]

## 2017-11-09 ENCOUNTER — Ambulatory Visit: Payer: BC Managed Care – PPO | Admitting: Family Medicine

## 2017-11-09 ENCOUNTER — Encounter: Payer: Self-pay | Admitting: Family Medicine

## 2017-11-09 VITALS — BP 100/60 | HR 90 | Temp 98.1°F | Resp 18 | Ht 72.0 in | Wt 185.0 lb

## 2017-11-09 DIAGNOSIS — M5431 Sciatica, right side: Secondary | ICD-10-CM | POA: Diagnosis not present

## 2017-11-09 DIAGNOSIS — M25561 Pain in right knee: Secondary | ICD-10-CM

## 2017-11-09 NOTE — Telephone Encounter (Signed)
Results were left on vm See imaging note - pt seen in ov today

## 2017-11-09 NOTE — Progress Notes (Signed)
Subjective:    Patient ID: Shelby Clark, female    DOB: June 08, 1956, 62 y.o.   MRN: [id number]  HPI  Patient has been suffering with right-sided sciatica since October, greater than 6 weeks. 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 She is tried physical therapy with no improvement. She continues to have pain radiating from her lower back into her upper posterior right hip and then down the anterior portion of her right leg to below her knee. She is taking Aleve on a daily basis without relief. She reports burning stinging pain in the right leg. She is altered her gait due to the pain. As a result, she is now irritated her right knee. She has a mild-to-moderate effusion in the right knee and pain over the medial and lateral joint lines with standing and walking. She is failing conservative therapy and requesting more aggressive treatment for her problems. Past Medical History:  Diagnosis Date  . Hyperlipidemia    Past Surgical History:  Procedure Laterality Date  . abdominal laparoscopy    . APPENDECTOMY  1986  . CHOLECYSTECTOMY  1992   No current outpatient medications on file prior to visit.   No current facility-administered medications on file prior to visit.    Allergies  Allergen Reactions  . Azithromycin Rash   Social History   Socioeconomic History  . Marital status: Married    Spouse name: Not on file  . Number of children: Not on file  . Years of education: Not on file  . Highest education level: Not on file  Social Needs  . Financial resource strain: Not on file  . Food insecurity - worry: Not on file  . Food insecurity - inability: Not on file  . Transportation needs - medical: Not on file  . Transportation needs - non-medical: Not on file  Occupational History  . Not on file  Tobacco Use  . Smoking status: Never Smoker  . Smokeless tobacco: Never Used  Substance and Sexual Activity  .  Alcohol use: No  . Drug use: No  . Sexual activity: Not on file  Other Topics Concern  . Not on file  Social History Narrative  . Not on file     Review of Systems  All other systems reviewed and are negative.      Objective:   Physical Exam  Cardiovascular: Normal rate and regular rhythm.  Pulmonary/Chest: Effort normal and breath sounds normal.  Musculoskeletal:       Right hip: She exhibits decreased range of motion and decreased strength. She exhibits no tenderness, no bony tenderness and no crepitus.       Right knee: She exhibits decreased range of motion, swelling and effusion. She exhibits normal meniscus. No medial joint line, no lateral joint line, no MCL and no LCL tenderness noted.       Lumbar back: She exhibits decreased range of motion and pain.  Vitals reviewed.         Assessment & Plan:  Right sided sciatica - Plan: MR Lumbar Spine Wo Contrast  Acute pain of right knee  I believe the patient's knee pain is likely an exacerbation of her arthritis due to an abnormal gait compensating for her low back pain with sciatica. She is failing conservative therapy and therefore I injected her right knee with 2 mL of lidocaine, 2 mL of Marcaine, 2 mL of 40 mg per mL Kenalog. Patient tolerated  the procedure well without complication. The pain in her hip may be referred pain from the lumbar spine given her sciatica that has persisted now for greater than 6 weeks. Therefore I'll proceed with an MRI of the lumbar spine. If there are significant nerve impingement, I would recommend an epidural steroid injection. If the MRI shows no significant nerve impingement, she may require orthopedic consultation for possibly an intra-articular cortisone injection in the right hip

## 2017-11-09 NOTE — Telephone Encounter (Signed)
Pt seen in ov today  

## 2017-11-11 ENCOUNTER — Ambulatory Visit
Admission: RE | Admit: 2017-11-11 | Discharge: 2017-11-11 | Disposition: A | Discharge status: Home / Self Care | Payer: BC Managed Care – PPO | Source: Ambulatory Visit | Attending: Family Medicine | Admitting: Family Medicine

## 2017-11-11 DIAGNOSIS — M5431 Sciatica, right side: Secondary | ICD-10-CM

## 2017-11-12 ENCOUNTER — Other Ambulatory Visit: Payer: Self-pay | Admitting: Family Medicine

## 2017-11-12 DIAGNOSIS — M25551 Pain in right hip: Secondary | ICD-10-CM

## 2017-11-16 ENCOUNTER — Other Ambulatory Visit: Payer: Self-pay | Admitting: Family Medicine

## 2017-11-16 ENCOUNTER — Telehealth: Payer: Self-pay | Admitting: Family Medicine

## 2017-11-16 DIAGNOSIS — M25551 Pain in right hip: Secondary | ICD-10-CM

## 2017-11-16 NOTE — Telephone Encounter (Signed)
Patient returned my call [facility] at 3:30 with Dr. Netta CorriganGioffree. [facility] so she can request CD of xray and MRI.

## 2017-11-16 NOTE — Telephone Encounter (Signed)
Originally recommended Dr. Ethelene Halamos for epidural steroid injections but MRI was clear, therefore, she needs to see orthopedist for hips.  Ramos does not inject hips. First available would be fine.

## 2017-11-16 NOTE — Telephone Encounter (Signed)
CB # [id number]  Patient states that you told her a name of the ortho you recommended she doesn't remember. I don't see it in your notes I [facility] your [facility]. If you can give me the providers name I will be glad to advise patient.

## 2018-01-28 ENCOUNTER — Other Ambulatory Visit: Payer: Self-pay

## 2018-01-28 ENCOUNTER — Emergency Department (HOSPITAL_COMMUNITY): Payer: BC Managed Care – PPO

## 2018-01-28 ENCOUNTER — Encounter (HOSPITAL_COMMUNITY): Payer: Self-pay | Admitting: *Deleted

## 2018-01-28 ENCOUNTER — Observation Stay (HOSPITAL_COMMUNITY)
Admission: EM | Admit: 2018-01-28 | Discharge: 2018-01-29 | Disposition: A | Discharge status: Home / Self Care | Payer: BC Managed Care – PPO | Attending: Internal Medicine | Admitting: Internal Medicine

## 2018-01-28 ENCOUNTER — Telehealth: Payer: Self-pay | Admitting: *Deleted

## 2018-01-28 DIAGNOSIS — M79604 Pain in right leg: Secondary | ICD-10-CM

## 2018-01-28 DIAGNOSIS — I82409 Acute embolism and thrombosis of unspecified deep veins of unspecified lower extremity: Secondary | ICD-10-CM | POA: Diagnosis not present

## 2018-01-28 DIAGNOSIS — M79661 Pain in right lower leg: Secondary | ICD-10-CM | POA: Diagnosis present

## 2018-01-28 DIAGNOSIS — I82411 Acute embolism and thrombosis of right femoral vein: Principal | ICD-10-CM | POA: Insufficient documentation

## 2018-01-28 DIAGNOSIS — Z79899 Other long term (current) drug therapy: Secondary | ICD-10-CM | POA: Diagnosis not present

## 2018-01-28 DIAGNOSIS — K921 Melena: Secondary | ICD-10-CM

## 2018-01-28 DIAGNOSIS — I82401 Acute embolism and thrombosis of unspecified deep veins of right lower extremity: Secondary | ICD-10-CM

## 2018-01-28 DIAGNOSIS — O2230 Deep phlebothrombosis in pregnancy, unspecified trimester: Secondary | ICD-10-CM | POA: Diagnosis present

## 2018-01-28 HISTORY — DX: Arthropathic psoriasis, unspecified: L40.50

## 2018-01-28 HISTORY — DX: Dorsalgia, unspecified: M54.9

## 2018-01-28 HISTORY — DX: Radiculopathy, lumbar region: M54.16

## 2018-01-28 HISTORY — DX: Other chronic pain: G89.29

## 2018-01-28 LAB — BASIC METABOLIC PANEL
Anion gap: 11 (ref 5–15)
BUN: 22 mg/dL — ABNORMAL HIGH (ref 6–20)
CALCIUM: 9.3 mg/dL (ref 8.9–10.3)
CO2: 23 mmol/L (ref 22–32)
CREATININE: 0.91 mg/dL (ref 0.44–1.00)
Chloride: 102 mmol/L (ref 101–111)
GFR calc non Af Amer: >60 mL/min (ref >60)
GLUCOSE: 101 mg/dL — AB (ref 65–99)
Potassium: 4 mmol/L (ref 3.5–5.1)
Sodium: 136 mmol/L (ref 135–145)

## 2018-01-28 LAB — CBC WITH DIFFERENTIAL/PLATELET
BASOS PCT: 0 %
Basophils Absolute: 0 10*3/uL (ref 0.0–0.1)
EOS ABS: 0.2 10*3/uL (ref 0.0–0.7)
Eosinophils Relative: 2 %
HCT: 38 % (ref 36.0–46.0)
Hemoglobin: 12 g/dL (ref 12.0–15.0)
Lymphocytes Relative: 11 %
Lymphs Abs: 1.1 10*3/uL (ref 0.7–4.0)
MCH: 29.1 pg (ref 26.0–34.0)
MCHC: 31.6 g/dL (ref 30.0–36.0)
MCV: 92.2 fL (ref 78.0–100.0)
MONO ABS: 1 10*3/uL (ref 0.1–1.0)
MONOS PCT: 10 %
Neutro Abs: 7.7 10*3/uL (ref 1.7–7.7)
Neutrophils Relative %: 77 %
Platelets: 236 10*3/uL (ref 150–400)
RBC: 4.12 MIL/uL (ref 3.87–5.11)
RDW: 13.7 % (ref 11.5–15.5)
WBC: 10.1 10*3/uL (ref 4.0–10.5)

## 2018-01-28 LAB — PROTIME-INR
INR: 1.13
PROTHROMBIN TIME: 14.5 s (ref 11.4–15.2)

## 2018-01-28 LAB — APTT: APTT: 30 s (ref 24–36)

## 2018-01-28 MED ORDER — ACETAMINOPHEN 325 MG PO TABS: 650.0000 mg | ORAL_TABLET | Freq: Four times a day (QID) | ORAL | Status: DC | PRN

## 2018-01-28 MED ORDER — ACETAMINOPHEN 650 MG RE SUPP: 650.0000 mg | Freq: Four times a day (QID) | RECTAL | Status: DC | PRN

## 2018-01-28 MED ORDER — HEPARIN (PORCINE) IN NACL 100-0.45 UNIT/ML-% IJ SOLN
1150.0000 [IU]/h | INTRAMUSCULAR | Status: DC
  Administered 2018-01-28: 1400 [IU]/h via INTRAVENOUS
  Administered 2018-01-29 (×2): 1250 [IU]/h via INTRAVENOUS
  Filled 2018-01-28 (×2): qty 250

## 2018-01-28 MED ORDER — RIVAROXABAN (XARELTO) EDUCATION KIT FOR DVT/PE PATIENTS: PACK | Freq: Once | Status: DC

## 2018-01-28 MED ORDER — RIVAROXABAN 15 MG PO TABS
15.0000 mg | ORAL_TABLET | Freq: Two times a day (BID) | ORAL | Status: DC
  Filled 2018-01-28 (×2): qty 1

## 2018-01-28 MED ORDER — FOLIC ACID 1 MG PO TABS
1.0000 mg | ORAL_TABLET | Freq: Every day | ORAL | Status: DC
  Administered 2018-01-29: 1 mg via ORAL
  Filled 2018-01-28: qty 1

## 2018-01-28 MED ORDER — SODIUM CHLORIDE 0.9 % IV SOLN
INTRAVENOUS | Status: DC
  Administered 2018-01-28: via INTRAVENOUS

## 2018-01-28 MED ORDER — HEPARIN BOLUS VIA INFUSION
5000.0000 [IU] | Freq: Once | INTRAVENOUS | Status: AC
  Administered 2018-01-28: 5000 [IU] via INTRAVENOUS

## 2018-01-28 MED ORDER — ONDANSETRON HCL 4 MG/2ML IJ SOLN: 4.0000 mg | Freq: Four times a day (QID) | INTRAMUSCULAR | Status: DC | PRN

## 2018-01-28 MED ORDER — ONDANSETRON HCL 4 MG PO TABS: 4.0000 mg | ORAL_TABLET | Freq: Four times a day (QID) | ORAL | Status: DC | PRN

## 2018-01-28 NOTE — Telephone Encounter (Signed)
Received call from patient. Requested appointment with PCP for Monday.   Reports Sx of blood clot in calf. Reports pain, tenderness, swelling around area and it is slightly red. Reports that she has had surgery in the past 2 months and thought it was a catch in the muscle this morning. States that she has been using a heating pad on area today and has been trying to "walk it out".   Advised to go to ER for evaluation now.   MD to be made aware.

## 2018-01-28 NOTE — ED Triage Notes (Signed)
Pt c/o swelling, redness and pain to right calf that started today. Pt's right calf is warm. 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

## 2018-01-28 NOTE — H&P (Signed)
TRH H&P    Patient Demographics:    Shelby Clark, is a 62 y.o. female  MRN: [id number]  DOB - 1956-05-08  Admit Date - 01/28/2018  Referring MD/NP/PA: Dr Clarene Duke  Outpatient Primary MD for the patient is Donita Brooks, MD  Patient coming from: Home   Chief complaint- Leg pain   HPI:    Shelby Clark  is a 62 y.o. female, with history of right hip replacement in February, recent diagnosis of psoriatic arthritis came to hospital with right lower extremity pain which started around 4 AM this morning.  This was associated with swelling.  Patient had difficulty walking.  In the ED ultrasound of the lower extremity showed DVT throughout the right lower extremity involving common femoral, femoral, profunda femoral, popliteal veins.  Vascular surgeon was consulted by the ED physician and he recommended IV heparin overnight with switching to Xarelto in a.m. Patient admits to have bloody bowel movement for past few days.  She did have colonoscopy 5 years ago at [facility], which was unremarkable as per patient.  She denies bloody bowel movement yesterday or today.  She denies abdominal pain. Patient also took long road trip to [place] a week ago.  She denies using birth control pills.  Denies smoking.  No family history of clotting disorder  she denies chest pain, no shortness of breath. Denies dysuria, urgency or frequency of urination. Denies nausea vomiting or diarrhea. No previous history of stroke or seizures.     Review of systems:    In addition to the HPI above,    All other systems reviewed and are negative.   With Past History of the following :    Past Medical History:  Diagnosis Date  . Chronic back pain   . Hyperlipidemia   . Lumbar radiculopathy, right   . Psoriatic arthritis [facility])       Past Surgical History:  Procedure Laterality Date  . abdominal laparoscopy    . APPENDECTOMY   1986  . CHOLECYSTECTOMY  1992  . TOTAL HIP ARTHROPLASTY Right       Social History:      Social History   Tobacco Use  . Smoking status: Never Smoker  . Smokeless tobacco: Never Used  Substance Use Topics  . Alcohol use: No       Family History :     Family History  Problem Relation Age of Onset  . Heart disease Father   . Heart disease Brother   . Cancer Brother   . Heart disease Brother       Home Medications:   Prior to Admission medications   Medication Sig Start Date End Date Taking? Authorizing Provider  folic acid (FOLVITE) 1 MG tablet  01/27/18   [provider]  methocarbamol (ROBAXIN) 500 MG tablet  11/27/17   [provider]  methotrexate 2.5 MG tablet  01/27/18   [provider]  predniSONE (DELTASONE) 5 MG tablet  01/27/18   [provider]  predniSONE (STERAPRED UNI-PAK 48 TAB) 10 MG (48) TBPK tablet  12/18/17   [provider]     Allergies:     Allergies  Allergen Reactions  . Azithromycin Rash     Physical Exam:   Vitals  Blood pressure (!) 159/97, pulse (!) 115, temperature 98.7 F (37.1 C), temperature source Oral, resp. rate 16, height 6' (1.829 m), weight 81.6 kg (180 lb), SpO2 98 %.  1.  General: Appears in no acute distress  2. Psychiatric:  Intact judgement and  insight, awake alert, oriented x 3.  3. Neurologic: No focal neurological deficits, all cranial nerves intact.Strength 5/5 all 4 extremities, sensation intact all 4 extremities, plantars down going.  4. Eyes :  anicteric sclerae, moist conjunctivae with no lid lag. PERRLA.  5. ENMT:  Oropharynx clear with moist mucous membranes and good dentition  6. Neck:  supple, no cervical lymphadenopathy appriciated, No thyromegaly  7. Respiratory : Normal respiratory effort, good air movement bilaterally,clear to  auscultation bilaterally  8. Cardiovascular : RRR, no gallops, rubs or murmurs,   9. Gastrointestinal:  Positive  bowel sounds, abdomen soft, non-tender to palpation,no hepatosplenomegaly, no rigidity or guarding       10. Skin:  No cyanosis, normal texture and turgor, no rash, lesions or ulcers  11.Musculoskeletal:  Right lowest extremity swelling noted, tender to palpation.  Homans sign negative    Data Review:    CBC Recent Labs  Lab 01/28/18 1715  WBC 10.1  HGB 12.0  HCT 38.0  PLT 236  MCV 92.2  MCH 29.1  MCHC 31.6  RDW 13.7  LYMPHSABS 1.1  MONOABS 1.0  EOSABS 0.2  BASOSABS 0.0   ------------------------------------------------------------------------------------------------------------------  Chemistries  Recent Labs  Lab 01/28/18 1715  NA 136  K 4.0  CL 102  CO2 23  GLUCOSE 101*  BUN 22*  CREATININE 0.91  CALCIUM 9.3   ------------------------------------------------------------------------------------------------------------------  ------------------------------------------------------------------------------------------------------------------ GFR: Estimated Creatinine Clearance: 75.9 mL/min (by C-G formula based on SCr of 0.91 mg/dL). Liver Function Tests: No results for input(s): AST, ALT, ALKPHOS, BILITOT, PROT, ALBUMIN in the last 168 hours. No results for input(s): LIPASE, AMYLASE in the last 168 hours. No results for input(s): AMMONIA in the last 168 hours. Coagulation Profile: Recent Labs  Lab 01/28/18 1718  INR 1.13   Cardiac Enzymes: No results for input(s): CKTOTAL, CKMB, CKMBINDEX, TROPONINI in the last 168 hours. BNP (last 3 results) No results for input(s): PROBNP in the last 8760 hours. HbA1C: No results for input(s): HGBA1C in the last 72 hours. CBG: No results for input(s): GLUCAP in the last 168 hours. Lipid Profile: No results for input(s): CHOL, HDL, LDLCALC, TRIG, CHOLHDL, LDLDIRECT in the last 72 hours. Thyroid Function Tests: No results for input(s): TSH, T4TOTAL, FREET4, T3FREE, THYROIDAB in the last 72 hours. Anemia Panel: No  results for input(s): VITAMINB12, FOLATE, FERRITIN, TIBC, IRON, RETICCTPCT in the last 72 hours.  --------------------------------------------------------------------------------------------------------------- Urine analysis:    Component Value Date/Time   BILIRUBINUR neg 01/17/2012 1445   PROTEINUR neg 01/17/2012 1445   UROBILINOGEN 0.2 01/17/2012 1445   NITRITE neg 01/17/2012 1445   LEUKOCYTESUR moderate (2+) 01/17/2012 1445      Imaging Results:    US Venous Img Lower Right (dvt Study)  Result Date: 01/28/2018 CLINICAL DATA:  Right hip arthroplasty. Right lower extremity swelling. EXAM: RIGHT LOWER EXTREMITY VENOUS DUPLEX ULTRASOUND TECHNIQUE: Doppler venous assessment of the right lower extremity deep venous system was performed, including characterization of spectral flow, compressibility, and phasicity. COMPARISON:  None. FINDINGS: The right common femoral, femoral, profunda femoral,  and popliteal veins are noncompressible. They contain occlusive thrombus. There is also occlusive thrombus in the posterior tibial and peroneal veins. IMPRESSION: There is extensive occlusive DVT throughout the right lower extremity as described. Critical Value/emergent results were called by telephone at the time of interpretation on 01/28/2018 at 4:51 pm to Dr. Samuel JesterKATHLEEN MCMANUS , who verbally acknowledged these results. Electronically Signed   By: Jolaine ClickArthur  Hoss M.D.   On: 01/28/2018 16:52      Assessment & Plan:    Active Problems:   DVT (deep vein thrombosis) in pregnancy (HCC)   1. DVT-patient started on IV heparin.  Continue heparin per pharmacy.  Consider switching to Xarelto in a.m.  2. History of hematochezia-patient had few episodes of bloody bowel movement over the past few days, though she denies any episodes today.  Will obtain stool for occult blood.  Hemoglobin is stable at 12.0.  Check CBC in a.m.  If patient starts having episodes of bloody bowel movement in the hospital consider GI  consult in a.m. She has no previous history of stomach ulcers, hemorrhoids.    DVT Prophylaxis-   Heparin  AM Labs Ordered, also please review Full Orders  Family Communication: Admission, patients condition and plan of care including tests being ordered have been discussed with the patient and her husband at bedside who indicate understanding and agree with the plan and Code Status.  Code Status:  Full code  Admission status: Observation  Time spent in minutes : 60 min   Meredeth IdeGagan S Leea Rambeau M.D on 01/28/2018 at 7:50 PM  Between 7am to 7pm - Pager - [phone]. After 7pm go to [email or web address] - password [email or web address]  [facility] - Office  [phone]

## 2018-01-28 NOTE — Progress Notes (Addendum)
ANTICOAGULATION CONSULT NOTE - Initial Consult  Pharmacy Consult for heparin Indication: VTE treatment  Allergies  Allergen Reactions  . Azithromycin Rash    Patient Measurements: Height: 6' (182.9 cm) Weight: 180 lb (81.6 kg) IBW/kg (Calculated) : 73.1 Heparin Dosing Weight: 81.6 kg  Vital Signs: Temp: 98.7 F (37.1 C) (04/04 1553) Temp Source: Oral (04/04 1553) BP: 159/97 (04/04 1553) Pulse Rate: 115 (04/04 1553)  Labs: Recent Labs    01/28/18 1715  HGB 12.0  HCT 38.0  PLT 236  CREATININE 0.91    Estimated Creatinine Clearance: 75.9 mL/min (by C-G formula based on SCr of 0.91 mg/dL).   Medical History: Past Medical History:  Diagnosis Date  . Chronic back pain   . Hyperlipidemia   . Lumbar radiculopathy, right   . Psoriatic arthritis (HCC)     Medications:   (Not in a hospital admission)  Assessment: Pharmacy consulted to dose heparin for VTE treatment. Goal of Therapy:  Heparin level 0.3-0.7 units/ml Monitor platelets by anticoagulation protocol: Yes   Plan:  Give 5000 units bolus x 1  Start heparin drip at 1400 units/hr Heparin level in 6 hours and daily CBC daily  Addendum: Heparin level drawn at 0154 result: 0.84 units/ml which is above desired goal range Decrease heparin drip to 1250 units/hr and recheck heparin level in 6 hours   Tad MooreSteven C Hurth 01/28/2018,6:51 PM

## 2018-01-28 NOTE — ED Provider Notes (Signed)
[facility] Provider [facility]   CSN: [id number] Arrival date & time: 01/28/18  1549     History   Chief Complaint Chief Complaint  Patient presents with  . Leg Pain    HPI Larey SeatSylvia C Clark is a 62 y.o. female.  HPI Pt was seen at 1600. Per pt, c/o gradual onset and persistence of constant right calf "pain" that began overnight last night. Pt c/o right calf "pressure," "swelling" and "redness." Pt states she has not been as mobile recently due to right hip replacement 2 months ago, and new dx psoriatic arthritis bilat knees over the past several weeks. Pt is concerned she "has a blood clot." States she took an ASA today. Denies CP/palpitations, no SOB/cough, no abd pain, no N/V/D, no fevers, no injury, no change in chronic joints pain, no focal motor weakness, no tingling/numbness in extremities.   Past Medical History:  Diagnosis Date  . Chronic back pain   . Hyperlipidemia   . Lumbar radiculopathy, right   . Psoriatic arthritis [facility](HCC)     Patient Active Problem List   Diagnosis Date Noted  . Hyperlipidemia     Past Surgical History:  Procedure Laterality Date  . abdominal laparoscopy    . APPENDECTOMY  1986  . CHOLECYSTECTOMY  1992  . TOTAL HIP ARTHROPLASTY Right      OB History   None      Home Medications    Prior to Admission medications   Medication Sig Start Date End Date Taking? Authorizing Provider  folic acid (FOLVITE) 1 MG tablet  01/27/18   [provider]  methocarbamol (ROBAXIN) 500 MG tablet  11/27/17   [provider]  methotrexate 2.5 MG tablet  01/27/18   [provider]  predniSONE (DELTASONE) 5 MG tablet  01/27/18   [provider]  predniSONE (STERAPRED UNI-PAK 48 TAB) 10 MG (48) TBPK tablet  12/18/17   [provider]    Family History Family History  Problem Relation Age of Onset  . Heart disease Father   . Heart disease Brother   . Cancer Brother   . Heart disease Brother      Social History Social History   Tobacco Use  . Smoking status: Never Smoker  . Smokeless tobacco: Never Used  Substance Use Topics  . Alcohol use: No  . Drug use: No     Allergies   Azithromycin   Review of Systems Review of Systems  ROS: Statement: All systems negative except as marked or noted in the HPI; Constitutional: Negative for fever and chills. ; ; Eyes: Negative for eye pain, redness and discharge. ; ; ENMT: Negative for ear pain, hoarseness, nasal congestion, sinus pressure and sore throat. ; ; Cardiovascular: Negative for chest pain, palpitations, diaphoresis, dyspnea and peripheral edema. ; ; Respiratory: Negative for cough, wheezing and stridor. ; ; Gastrointestinal: Negative for nausea, vomiting, diarrhea, abdominal pain, blood in stool, hematemesis, jaundice and rectal bleeding. . ; ; Genitourinary: Negative for dysuria, flank pain and hematuria. ; ; Musculoskeletal: +right calf pain. Negative for back pain and neck pain. Negative for trauma.; ; Skin: +rash. Negative for pruritus, abrasions, blisters, bruising and skin lesion.; ; Neuro: Negative for headache, lightheadedness and neck stiffness. Negative for weakness, altered level of consciousness, altered mental status, extremity weakness, paresthesias, involuntary movement, seizure and syncope.        Physical Exam Updated Vital Signs BP (!) 159/97 (BP Location: Right Arm)   Pulse (!) 115  Temp 98.7 F (37.1 C) (Oral)   Resp 16   Ht 6' (1.829 m)   Wt 81.6 kg (180 lb)   SpO2 98%   BMI 24.41 kg/m   Physical Exam 1605: Physical examination:  Nursing notes reviewed; Vital signs and O2 SAT reviewed;  Constitutional: Well developed, Well nourished, Well hydrated, In no acute distress; Head:  Normocephalic, atraumatic; Eyes: EOMI, PERRL, No scleral icterus; ENMT: Mouth and pharynx normal, Mucous membranes moist; Neck: Supple, Full range of motion, No lymphadenopathy; Cardiovascular: Regular rate and rhythm, No  gallop; Respiratory: Breath sounds clear & equal bilaterally, No wheezes.  Speaking full sentences with ease, Normal respiratory effort/excursion; Chest: Nontender, Movement normal; Abdomen: Soft, Nontender, Nondistended, Normal bowel sounds; Genitourinary: No CVA tenderness; Extremities: Peripheral pulses normal, No deformity, NT bilat knees/ankles/feet. +2 RLE edema from thigh to ankle with calf edema, warmth, erythema, and asymmetry. Brisk cap refill in toes, strong palp pedal pulses..; Neuro: AA&Ox3, Major CN grossly intact.  Speech clear. No gross focal motor or sensory deficits in extremities.; Skin: Color normal, Warm, Dry.   ED Treatments / Results  Labs (all labs ordered are listed, but only abnormal results are displayed)   EKG None  Radiology   Procedures Procedures (including critical care time)  Medications Ordered in ED Medications - No data to display   Initial Impression / Assessment and Plan / ED Course  I have reviewed the triage vital signs and the nursing notes.  Pertinent labs & imaging results that were available during my care of the patient were reviewed by me and considered in my medical decision making (see chart for details).  MDM Reviewed: previous chart, nursing note and vitals Reviewed previous: labs Interpretation: labs and ultrasound   Results for orders placed or performed during the hospital encounter of 01/28/18  Basic metabolic panel  Result Value Ref Range   Sodium 136 135 - 145 mmol/L   Potassium 4.0 3.5 - 5.1 mmol/L   Chloride 102 101 - 111 mmol/L   CO2 23 22 - 32 mmol/L   Glucose, Bld 101 (H) 65 - 99 mg/dL   BUN 22 (H) 6 - 20 mg/dL   Creatinine, Ser 1.61 0.44 - 1.00 mg/dL   Calcium 9.3 8.9 - 09.6 mg/dL   GFR calc non Af Amer >60 >60 mL/min   GFR calc Af Amer >60 >60 mL/min   Anion gap 11 5 - 15  CBC with Differential  Result Value Ref Range   WBC 10.1 4.0 - 10.5 K/uL   RBC 4.12 3.87 - 5.11 MIL/uL   Hemoglobin 12.0 12.0 - 15.0  g/dL   HCT 04.5 40.9 - 81.1 %   MCV 92.2 78.0 - 100.0 fL   MCH 29.1 26.0 - 34.0 pg   MCHC 31.6 30.0 - 36.0 g/dL   RDW 91.4 78.2 - 95.6 %   Platelets 236 150 - 400 K/uL   Neutrophils Relative % 77 %   Neutro Abs 7.7 1.7 - 7.7 K/uL   Lymphocytes Relative 11 %   Lymphs Abs 1.1 0.7 - 4.0 K/uL   Monocytes Relative 10 %   Monocytes Absolute 1.0 0.1 - 1.0 K/uL   Eosinophils Relative 2 %   Eosinophils Absolute 0.2 0.0 - 0.7 K/uL   Basophils Relative 0 %   Basophils Absolute 0.0 0.0 - 0.1 K/uL     US Venous Img Lower Right (dvt Study) Result Date: 01/28/2018 CLINICAL DATA:  Right hip arthroplasty. Right lower extremity swelling. EXAM: RIGHT LOWER EXTREMITY  VENOUS DUPLEX ULTRASOUND TECHNIQUE: Doppler venous assessment of the right lower extremity deep venous system was performed, including characterization of spectral flow, compressibility, and phasicity. COMPARISON:  None. FINDINGS: The right common femoral, femoral, profunda femoral, and popliteal veins are noncompressible. They contain occlusive thrombus. There is also occlusive thrombus in the posterior tibial and peroneal veins. IMPRESSION: There is extensive occlusive DVT throughout the right lower extremity as described. Critical Value/emergent results were called by telephone at the time of interpretation on 01/28/2018 at 4:51 pm to Dr. Samuel Jester , who verbally acknowledged these results. Electronically Signed   By: Jolaine Click M.D.   On: 01/28/2018 16:52    1815:  Extensive, occlusive RLE DVT.  T/C returned from [facility] Dr. Edilia Bo, case discussed, including:  HPI, pertinent PM/SHx, VS/PE, dx testing, ED course and treatment: not interventional candidate at this time, IV heparin, start xarelto, admit [facility].   1910:  IV heparin ordered. Dx and testing, as well as d/w Vasc MD, d/w pt and family.  Questions answered.  Verb understanding, agreeable to admit. T/C returned from [facility] Dr. Sharl Ma, case discussed, including:  HPI, pertinent  PM/SHx, VS/PE, dx testing, ED course and treatment:  Agreeable to admit.     Final Clinical Impressions(s) / ED Diagnoses   Final diagnoses:  None    ED Discharge Orders    None       Samuel Jester, DO 02/01/18 0805

## 2018-01-29 ENCOUNTER — Other Ambulatory Visit: Payer: Self-pay

## 2018-01-29 ENCOUNTER — Encounter (HOSPITAL_COMMUNITY): Payer: Self-pay

## 2018-01-29 DIAGNOSIS — I82401 Acute embolism and thrombosis of unspecified deep veins of right lower extremity: Secondary | ICD-10-CM | POA: Diagnosis not present

## 2018-01-29 LAB — COMPREHENSIVE METABOLIC PANEL
ALK PHOS: 63 U/L (ref 38–126)
ALT: 13 U/L — ABNORMAL LOW (ref 14–54)
AST: 14 U/L — AB (ref 15–41)
Albumin: 3.3 g/dL — ABNORMAL LOW (ref 3.5–5.0)
Anion gap: 11 (ref 5–15)
BUN: 18 mg/dL (ref 6–20)
CALCIUM: 8.7 mg/dL — AB (ref 8.9–10.3)
CO2: 26 mmol/L (ref 22–32)
Chloride: 105 mmol/L (ref 101–111)
Creatinine, Ser: 0.79 mg/dL (ref 0.44–1.00)
Glucose, Bld: 108 mg/dL — ABNORMAL HIGH (ref 65–99)
Potassium: 3.6 mmol/L (ref 3.5–5.1)
Sodium: 142 mmol/L (ref 135–145)
Total Bilirubin: 0.7 mg/dL (ref 0.3–1.2)
Total Protein: 6.3 g/dL — ABNORMAL LOW (ref 6.5–8.1)

## 2018-01-29 LAB — CBC
HEMATOCRIT: 37 % (ref 36.0–46.0)
HEMOGLOBIN: 11.6 g/dL — AB (ref 12.0–15.0)
MCH: 29.1 pg (ref 26.0–34.0)
MCHC: 31.4 g/dL (ref 30.0–36.0)
MCV: 93 fL (ref 78.0–100.0)
Platelets: 231 10*3/uL (ref 150–400)
RBC: 3.98 MIL/uL (ref 3.87–5.11)
RDW: 13.8 % (ref 11.5–15.5)
WBC: 9.6 10*3/uL (ref 4.0–10.5)

## 2018-01-29 LAB — HEPARIN LEVEL (UNFRACTIONATED)
HEPARIN UNFRACTIONATED: 0.8 [IU]/mL — AB (ref 0.30–0.70)
Heparin Unfractionated: 0.84 IU/mL — ABNORMAL HIGH (ref 0.30–0.70)

## 2018-01-29 MED ORDER — RIVAROXABAN 15 MG PO TABS
15.0000 mg | ORAL_TABLET | Freq: Two times a day (BID) | ORAL | Status: DC
  Administered 2018-01-29: 15 mg via ORAL
  Filled 2018-01-29: qty 1

## 2018-01-29 MED ORDER — RIVAROXABAN 20 MG PO TABS: 20.0000 mg | ORAL_TABLET | Freq: Every day | ORAL | 2 refills | Status: DC

## 2018-01-29 MED ORDER — RIVAROXABAN (XARELTO) VTE STARTER PACK (15 & 20 MG): ORAL_TABLET | ORAL | 0 refills | Status: DC

## 2018-01-29 NOTE — Progress Notes (Signed)
ANTICOAGULATION CONSULT NOTE  Pharmacy Consult for heparin Indication: VTE treatment  Allergies  Allergen Reactions  . Azithromycin Rash    Patient Measurements: Height: 6' (182.9 cm) Weight: 176 lb 9.4 oz (80.1 kg) IBW/kg (Calculated) : 73.1 Heparin Dosing Weight: 81.6 kg  Vital Signs: Temp: 98.7 F (37.1 C) (04/05 0607) Temp Source: Oral (04/05 0607) BP: 104/67 (04/05 0607) Pulse Rate: 67 (04/05 0607)  Labs: Recent Labs    01/28/18 1715 01/28/18 1718 01/29/18 0153 01/29/18 0953  HGB 12.0  --  11.6*  --   HCT 38.0  --  37.0  --   PLT 236  --  231  --   APTT  --  30  --   --   LABPROT  --  14.5  --   --   INR  --  1.13  --   --   HEPARINUNFRC  --   --  0.84* 0.80*  CREATININE 0.91  --  0.79  --     Estimated Creatinine Clearance: 86.3 mL/min (by C-G formula based on SCr of 0.79 mg/dL).   Medical History: Past Medical History:  Diagnosis Date  . Chronic back pain   . Hyperlipidemia   . Lumbar radiculopathy, right   . Psoriatic arthritis (HCC)     Medications:  Medications Prior to Admission  Medication Sig Dispense Refill Last Dose  . folic acid (FOLVITE) 1 MG tablet    unknown  . methocarbamol (ROBAXIN) 500 MG tablet    Completed Course at Unknown time  . methotrexate 2.5 MG tablet    unknown  . predniSONE (DELTASONE) 5 MG tablet    unknown  . predniSONE (STERAPRED UNI-PAK 48 TAB) 10 MG (48) TBPK tablet    Completed Course at Unknown time    Assessment: Pharmacy consulted to dose heparin for VTE treatment. HL 0.8  Goal of Therapy:  Heparin level 0.3-0.7 units/ml Monitor platelets by anticoagulation protocol: Yes   Plan:  Decrease heparin drip to 1150 units/hr Heparin level in 6 hours and daily while on heparin CBC daily Plan is to switch to oral anticoagulant but will monitor heparin until then  Judeth CornfieldSteven Dhana Totton, PharmD Clinical Pharmacist 01/29/2018 11:41 AM

## 2018-01-29 NOTE — Progress Notes (Signed)
Pt arrived to 309 via stretcher from ED. Hep gtt infusing at ordered rate of 2114ml/hr (1400units/hr). Oriented to room and equipment. Telemetry applied. PAS and skin swarm completed. Warmth and +1 edema noted to RLE. Healing incision noted to rt hip and WNL. No other skin issues noted. C/o 5/10 pain to RLE, however denies need for pain meds. No needs voiced at this time. Will continue to monitor.

## 2018-01-29 NOTE — Progress Notes (Signed)
Hep level 0.84. Pharmacy notified. Instructed by Ovidio HangerAnn Mason, RPH to decrease heparin gtt rate to 12.145ml/hr @ 1,250units/hr.

## 2018-01-29 NOTE — Discharge Summary (Signed)
Physician Discharge Summary  Roque LiasSylvia C Penado ZOX:[id number] DOB: 1956/05/30 DOA: 01/28/2018  PCP: Donita BrooksPickard, Warren T, MD  Admit date: 01/28/2018 Discharge date: 01/29/2018  Time spent: 45 minutes  Recommendations for Outpatient Follow-up:  -Will be discharged home today. -Advised to follow up with PCP in 2 weeks. -Has been started on Xarelto.   Discharge Diagnoses:  Active Problems:   DVT (deep vein thrombosis) in pregnancy [facility](HCC)   Discharge Condition: Stable and improved  Filed Weights   01/28/18 1554 01/28/18 2200  Weight: 81.6 kg (180 lb) 80.1 kg (176 lb 9.4 oz)    History of present illness:  As per Dr. Sharl MaLama on 4/4:  Shelby Clark  is a 62 y.o. female, with history of right hip replacement in February, recent diagnosis of psoriatic arthritis came to hospital with right lower extremity pain which started around 4 AM this morning.  This was associated with swelling.  Patient had difficulty walking.  In the ED ultrasound of the lower extremity showed DVT throughout the right lower extremity involving common femoral, femoral, profunda femoral, popliteal veins.  Vascular surgeon was consulted by the ED physician and he recommended IV heparin overnight with switching to Xarelto in a.m. Patient admits to have bloody bowel movement for past few days.  She did have colon[facility] years [facility], which was unremarkable as per patient.  She denies bloody bowel movement yesterday or today.  She denies abdominal pain. Patient also took long road trip to [place] a week ago.  She denies using birth control pills.  Denies smoking.  No family history of clotting disorder  she denies chest pain, no shortness of breath. Denies dysuria, urgency or frequency of urination. Denies nausea vomiting or diarrhea. No previous history of stroke or seizures.     Hospital Course:   Acute DVT of right lower extremity -This is considered to be a provoked DVT given her hip replacement surgery 45  days ago, her prolonged immobility due to bilateral knee psoriatic arthritis as well as a recent 7-hour car trip 7 days ago. -She was initially placed on a heparin drip and has subsequently been switched over to Xarelto. -I would shoot for 3-9 months of therapy.    Procedures:  None   Consultations:  None  Discharge Instructions  Discharge Instructions    Diet - low sodium heart healthy   Complete by:  As directed    Increase activity slowly   Complete by:  As directed      Allergies as of 01/29/2018      Reactions   Azithromycin Rash      Medication List    STOP taking these medications   methotrexate 2.5 MG tablet   predniSONE 10 MG (48) Tbpk tablet Commonly known as:  STERAPRED UNI-PAK 48 TAB   predniSONE 5 MG tablet Commonly known as:  DELTASONE     TAKE these medications   folic acid 1 MG tablet Commonly known as:  FOLVITE   methocarbamol 500 MG tablet Commonly known as:  ROBAXIN   Rivaroxaban 15 & 20 MG Tbpk Take as directed on package: Start with one 15mg  tablet by mouth twice a day with food. On Day 22, switch to one 20mg  tablet once a day with food.   rivaroxaban 20 MG Tabs tablet Commonly known as:  XARELTO Take 1 tablet (20 mg total) by mouth daily with supper. To start after you complete the starter pack      Allergies  Allergen Reactions  .  Azithromycin Rash   Follow-up Information    Donita Brooks, MD. Schedule an appointment as soon as possible for a visit on 02/01/2018.   Specialty:  Family Medicine Why:  Rescheduled time for appt on Mon to 3:00pm Contact information: [address] [place] [address] [phone]            The results of significant diagnostics from this hospitalization (including imaging, microbiology, ancillary and laboratory) are listed below for reference.    Significant Diagnostic Studies: US Venous Img Lower Right (dvt Study)  Result Date: 01/28/2018 CLINICAL DATA:  Right hip arthroplasty.  Right lower extremity swelling. EXAM: RIGHT LOWER EXTREMITY VENOUS DUPLEX ULTRASOUND TECHNIQUE: Doppler venous assessment of the right lower extremity deep venous system was performed, including characterization of spectral flow, compressibility, and phasicity. COMPARISON:  None. FINDINGS: The right common femoral, femoral, profunda femoral, and popliteal veins are noncompressible. They contain occlusive thrombus. There is also occlusive thrombus in the posterior tibial and peroneal veins. IMPRESSION: There is extensive occlusive DVT throughout the right lower extremity as described. Critical Value/emergent results were called by telephone at the time of interpretation on 01/28/2018 at 4:51 pm to Dr. Samuel Jester , who verbally acknowledged these results. Electronically Signed   By: Jolaine Click M.D.   On: 01/28/2018 16:52    Microbiology: No results found for this or any previous visit (from the past 240 hour(s)).   Labs: Basic Metabolic Panel: Recent Labs  Lab 01/28/18 1715 01/29/18 0153  NA 136 142  K 4.0 3.6  CL 102 105  CO2 23 26  GLUCOSE 101* 108*  BUN 22* 18  CREATININE 0.91 0.79  CALCIUM 9.3 8.7*   Liver Function Tests: Recent Labs  Lab 01/29/18 0153  AST 14*  ALT 13*  ALKPHOS 63  BILITOT 0.7  PROT 6.3*  ALBUMIN 3.3*   No results for input(s): LIPASE, AMYLASE in the last 168 hours. No results for input(s): AMMONIA in the last 168 hours. CBC: Recent Labs  Lab 01/28/18 1715 01/29/18 0153  WBC 10.1 9.6  NEUTROABS 7.7  --   HGB 12.0 11.6*  HCT 38.0 37.0  MCV 92.2 93.0  PLT 236 231   Cardiac Enzymes: No results for input(s): CKTOTAL, CKMB, CKMBINDEX, TROPONINI in the last 168 hours. BNP: BNP (last 3 results) No results for input(s): BNP in the last 8760 hours.  ProBNP (last 3 results) No results for input(s): PROBNP in the last 8760 hours.  CBG: No results for input(s): GLUCAP in the last 168 hours.     Signed:  Chaya Jan  [facility] Pager: [phone] 01/29/2018, 4:52 PM

## 2018-01-29 NOTE — Progress Notes (Signed)
Discharge instructions read to patient and her husband.  All verbalized understanding of instruction. Discharge to home with husband.

## 2018-01-29 NOTE — Care Management (Signed)
Benefits check done for Eliquis and Xarelto. Patient has no copay for either medication.

## 2018-01-29 NOTE — Progress Notes (Signed)
ANTICOAGULATION CONSULT NOTE  Pharmacy Consult for heparin---> xarelto Indication: VTE treatment  Allergies  Allergen Reactions  . Azithromycin Rash    Patient Measurements: Height: 6' (182.9 cm) Weight: 176 lb 9.4 oz (80.1 kg) IBW/kg (Calculated) : 73.1 Heparin Dosing Weight: 81.6 kg  Vital Signs: Temp: 98.7 F (37.1 C) (04/05 0607) Temp Source: Oral (04/05 0607) BP: 104/67 (04/05 0607) Pulse Rate: 67 (04/05 0607)  Labs: Recent Labs    01/28/18 1715 01/28/18 1718 01/29/18 0153 01/29/18 0953  HGB 12.0  --  11.6*  --   HCT 38.0  --  37.0  --   PLT 236  --  231  --   APTT  --  30  --   --   LABPROT  --  14.5  --   --   INR  --  1.13  --   --   HEPARINUNFRC  --   --  0.84* 0.80*  CREATININE 0.91  --  0.79  --     Estimated Creatinine Clearance: 86.3 mL/min (by C-G formula based on SCr of 0.79 mg/dL).   Medical History: Past Medical History:  Diagnosis Date  . Chronic back pain   . Hyperlipidemia   . Lumbar radiculopathy, right   . Psoriatic arthritis (HCC)     Medications:  Medications Prior to Admission  Medication Sig Dispense Refill Last Dose  . folic acid (FOLVITE) 1 MG tablet    unknown  . methocarbamol (ROBAXIN) 500 MG tablet    Completed Course at Unknown time  . methotrexate 2.5 MG tablet    unknown  . predniSONE (DELTASONE) 5 MG tablet    unknown  . predniSONE (STERAPRED UNI-PAK 48 TAB) 10 MG (48) TBPK tablet    Completed Course at Unknown time    Assessment: Pharmacy consulted to dose heparin for VTE treatment. HL 0.8. Plan now is to d/c heparin and transition to PO Xarelto  Goal of Therapy:  Monitor platelets by anticoagulation protocol: Yes   Plan:  D/C heparin infusion. After heparin discontinued, start xarelto 1 hour later Xarelto 15mg  po bid x 21 days, then 20mg  daily Monitor for s/s of bleeding Educate on Xarelto  Elder CyphersLorie Bonnie Roig, BS Pharm D, [place] Clinical Pharmacist Pager [phone] 01/29/2018 12:00 PM

## 2018-01-30 LAB — HIV ANTIBODY (ROUTINE TESTING W REFLEX): HIV SCREEN 4TH GENERATION: NONREACTIVE

## 2018-02-01 ENCOUNTER — Ambulatory Visit (INDEPENDENT_AMBULATORY_CARE_PROVIDER_SITE_OTHER): Payer: BC Managed Care – PPO | Admitting: Family Medicine

## 2018-02-01 ENCOUNTER — Encounter: Payer: Self-pay | Admitting: Family Medicine

## 2018-02-01 ENCOUNTER — Ambulatory Visit: Payer: BC Managed Care – PPO | Admitting: Family Medicine

## 2018-02-01 VITALS — BP 122/68 | HR 90 | Temp 98.8°F | Resp 14 | Ht 72.0 in | Wt 180.0 lb

## 2018-02-01 DIAGNOSIS — I82401 Acute embolism and thrombosis of unspecified deep veins of right lower extremity: Secondary | ICD-10-CM

## 2018-02-01 DIAGNOSIS — Z780 Asymptomatic menopausal state: Secondary | ICD-10-CM | POA: Diagnosis not present

## 2018-02-01 DIAGNOSIS — Z23 Encounter for immunization: Secondary | ICD-10-CM | POA: Diagnosis not present

## 2018-02-01 DIAGNOSIS — Z09 Encounter for follow-up examination after completed treatment for conditions other than malignant neoplasm: Secondary | ICD-10-CM

## 2018-02-01 DIAGNOSIS — I824Z1 Acute embolism and thrombosis of unspecified deep veins of right distal lower extremity: Secondary | ICD-10-CM | POA: Diagnosis not present

## 2018-02-01 DIAGNOSIS — I82409 Acute embolism and thrombosis of unspecified deep veins of unspecified lower extremity: Secondary | ICD-10-CM | POA: Insufficient documentation

## 2018-02-01 MED ORDER — RIVAROXABAN 20 MG PO TABS: 20.0000 mg | ORAL_TABLET | Freq: Every day | ORAL | 4 refills | Status: DC

## 2018-02-01 NOTE — Progress Notes (Signed)
Subjective:    Patient ID: Shelby Clark, female    DOB: [birth year], 62 y.o.   MRN: [id number]  HPI  Since I last saw the patient, she underwent a right hip replacement in February.  Postoperative course was complicated by bilateral knee pain there was ultimately determined to be psoriatic arthritis.  This was diagnosed under the care of a rheumatologist.  Patient was relatively sedentary at this point due to knee pain and postoperative..  She then underwent a 7-hour car ride to [place].  Shortly after returning she developed severe pain and swelling in her right leg distal to her knee.  Once it extended above her right knee, she went to the emergency room where she was diagnosed with a DVT.  The day prior, she had bright red blood per rectum presumed to be due to a hemorrhoid per her report.  In the emergency room, they started her on heparin and then discharge the patient home on Xarelto.  She is experienced no GI bleeding since that time.  She denies any melena or hematochezia.  She would like to go ahead and start methotrexate for treatment of psoriatic arthritis however her rheumatologist has recommended that she receive the shingles vaccine prior to starting this.  She would like to receive the shingles vaccine today.  She is already received Pneumovax 23. Past Medical History:  Diagnosis Date  . Chronic back pain   . DVT (deep venous thrombosis) (HCC)    right leg after hip replacement and long car ride  . Hyperlipidemia   . Lumbar radiculopathy, right   . Psoriatic arthritis [facility])    Past Surgical History:  Procedure Laterality Date  . abdominal laparoscopy    . APPENDECTOMY  1986  . CHOLECYSTECTOMY  1992  . TOTAL HIP ARTHROPLASTY Right    Current Outpatient Medications on File Prior to Visit  Medication Sig Dispense Refill  . folic acid (FOLVITE) 1 MG tablet     . methocarbamol (ROBAXIN) 500 MG tablet     . rivaroxaban (XARELTO) 20 MG TABS tablet Take 1 tablet (20 mg total) by  mouth daily with supper. To start after you complete the starter pack 30 tablet 2  . Rivaroxaban 15 & 20 MG TBPK Take as directed on package: Start with one 15mg  tablet by mouth twice a day with food. On Day 22, switch to one 20mg  tablet once a day with food. 51 each 0   No current facility-administered medications on file prior to visit.    Allergies  Allergen Reactions  . Azithromycin Rash   Social History   Socioeconomic History  . Marital status: Married    Spouse name: Not on file  . Number of children: Not on file  . Years of education: Not on file  . Highest education level: Not on file  Occupational History  . Not on file  Social Needs  . Financial resource strain: Not on file  . Food insecurity:    Worry: Not on file    Inability: Not on file  . Transportation needs:    Medical: Not on file    Non-medical: Not on file  Tobacco Use  . Smoking status: Never Smoker  . Smokeless tobacco: Never Used  Substance and Sexual Activity  . Alcohol use: No  . Drug use: No  . Sexual activity: Not on file  Lifestyle  . Physical activity:    Days per week: Not on file    Minutes per session: Not  on file  . Stress: Not on file  Relationships  . Social connections:    Talks on phone: Not on file    Gets together: Not on file    Attends religious service: Not on file    Active member of club or organization: Not on file    Attends meetings of clubs or organizations: Not on file    Relationship status: Not on file  . Intimate partner violence:    Fear of current or ex partner: Not on file    Emotionally abused: Not on file    Physically abused: Not on file    Forced sexual activity: Not on file  Other Topics Concern  . Not on file  Social History Narrative  . Not on file     Review of Systems  All other systems reviewed and are negative.      Objective:   Physical Exam  Constitutional: She appears well-developed and well-nourished.  Neck: Neck supple. No JVD  present.  Cardiovascular: Normal rate, regular rhythm and normal heart sounds. Exam reveals no gallop and no friction rub.  No murmur heard. Pulmonary/Chest: Effort normal and breath sounds normal. No respiratory distress. She has no wheezes. She has no rales.  Abdominal: Soft. Bowel sounds are normal. She exhibits no distension. There is no tenderness. There is no rebound and no guarding.  Musculoskeletal: She exhibits no edema.  Lymphadenopathy:    She has no cervical adenopathy.  Vitals reviewed.         Assessment & Plan:  Acute deep vein thrombosis (DVT) of distal vein of right lower extremity (HCC) - Plan: CBC with Differential/Platelet, COMPLETE METABOLIC PANEL WITH GFR  Need for vaccination for zoster - Plan: Varicella-zoster vaccine IM  Hospital discharge follow-up  Acute deep vein thrombosis (DVT) of right lower extremity, unspecified vein (HCC)  Finish Xarelto 15 mg twice daily for a total of 21 days.  Then switch to Xarelto 20 mg a day to complete a total of 6 months.  Therefore in October she will discontinue medication.  Recheck CBC today to ensure no evidence of an occult GI blood loss.  I would like to repeat a CBC in 1 month to monitor her platelet count on medication.  She received her first dose of the shingles vaccine today.  Follow-up with rheumatologist as planned to discuss starting methotrexate.  Recommended scheduling the patient for a bone density test given her use of prednisone and methotrexate.  Recommended 1200 mg a day of calcium and 1000 units a day of vitamin D

## 2018-02-02 LAB — COMPLETE METABOLIC PANEL WITH GFR
AG Ratio: 1.3 (calc) (ref 1.0–2.5)
ALBUMIN MSPROF: 3.7 g/dL (ref 3.6–5.1)
ALKALINE PHOSPHATASE (APISO): 69 U/L (ref 33–130)
ALT: 14 U/L (ref 6–29)
AST: 18 U/L (ref 10–35)
BILIRUBIN TOTAL: 0.4 mg/dL (ref 0.2–1.2)
BUN: 18 mg/dL (ref 7–25)
CHLORIDE: 103 mmol/L (ref 98–110)
CO2: 30 mmol/L (ref 20–32)
CREATININE: 0.86 mg/dL (ref 0.50–0.99)
Calcium: 9.3 mg/dL (ref 8.6–10.4)
GFR, Est African American: 85 mL/min/{1.73_m2} (ref >=60)
GFR, Est Non African American: 73 mL/min/{1.73_m2} (ref >=60)
Globulin: 2.9 g/dL (calc) (ref 1.9–3.7)
Glucose, Bld: 105 mg/dL — ABNORMAL HIGH (ref 65–99)
Potassium: 4.4 mmol/L (ref 3.5–5.3)
Sodium: 140 mmol/L (ref 135–146)
Total Protein: 6.6 g/dL (ref 6.1–8.1)

## 2018-02-02 LAB — CBC WITH DIFFERENTIAL/PLATELET
BASOS ABS: 47 {cells}/uL (ref 0–200)
BASOS PCT: 0.5 %
EOS ABS: 344 {cells}/uL (ref 15–500)
Eosinophils Relative: 3.7 %
HEMATOCRIT: 37 % (ref 35.0–45.0)
HEMOGLOBIN: 12.6 g/dL (ref 11.7–15.5)
LYMPHS ABS: 1200 {cells}/uL (ref 850–3900)
MCH: 29.7 pg (ref 27.0–33.0)
MCHC: 34.1 g/dL (ref 32.0–36.0)
MCV: 87.3 fL (ref 80.0–100.0)
MPV: 9.9 fL (ref 7.5–12.5)
Monocytes Relative: 8.9 %
NEUTROS ABS: 6882 {cells}/uL (ref 1500–7800)
Neutrophils Relative %: 74 %
Platelets: 335 10*3/uL (ref 140–400)
RBC: 4.24 10*6/uL (ref 3.80–5.10)
RDW: 13.1 % (ref 11.0–15.0)
Total Lymphocyte: 12.9 %
WBC: 9.3 10*3/uL (ref 3.8–10.8)
WBCMIX: 828 {cells}/uL (ref 200–950)

## 2018-02-04 ENCOUNTER — Other Ambulatory Visit: Payer: Self-pay | Admitting: *Deleted

## 2018-02-04 ENCOUNTER — Other Ambulatory Visit: Payer: BC Managed Care – PPO

## 2018-02-04 MED ORDER — CETIRIZINE HCL 10 MG PO TABS: 10.0000 mg | ORAL_TABLET | Freq: Every day | ORAL | 11 refills | Status: AC

## 2018-02-05 ENCOUNTER — Ambulatory Visit
Admission: RE | Admit: 2018-02-05 | Discharge: 2018-02-05 | Disposition: A | Discharge status: Home / Self Care | Payer: BC Managed Care – PPO | Source: Ambulatory Visit | Attending: Family Medicine | Admitting: Family Medicine

## 2018-02-05 DIAGNOSIS — Z780 Asymptomatic menopausal state: Secondary | ICD-10-CM

## 2018-02-26 ENCOUNTER — Ambulatory Visit (INDEPENDENT_AMBULATORY_CARE_PROVIDER_SITE_OTHER): Payer: BC Managed Care – PPO | Admitting: Family Medicine

## 2018-02-26 ENCOUNTER — Encounter: Payer: Self-pay | Admitting: Family Medicine

## 2018-02-26 VITALS — BP 122/68 | HR 88 | Temp 99.3°F | Resp 12 | Ht 72.0 in | Wt 183.0 lb

## 2018-02-26 DIAGNOSIS — I824Z1 Acute embolism and thrombosis of unspecified deep veins of right distal lower extremity: Secondary | ICD-10-CM

## 2018-02-26 DIAGNOSIS — L4050 Arthropathic psoriasis, unspecified: Secondary | ICD-10-CM

## 2018-02-26 DIAGNOSIS — Z7901 Long term (current) use of anticoagulants: Secondary | ICD-10-CM

## 2018-02-26 MED ORDER — PREDNISONE 5 MG PO TABS: 15.0000 mg | ORAL_TABLET | Freq: Every day | ORAL | 0 refills | Status: DC

## 2018-02-26 NOTE — Progress Notes (Signed)
Subjective:    Patient ID: Shelby Clark, female    DOB: 06-18-1956, 62 y.o.   MRN: [id number]  HPI 02/01/18 Since I last saw the patient, she underwent a right hip replacement in February.  Postoperative course was complicated by bilateral knee pain there was ultimately determined to be psoriatic arthritis.  This was diagnosed under the care of a rheumatologist.  Patient was relatively sedentary at this point due to knee pain and postoperative..  She then underwent a 7-hour car ride to [place].  Shortly after returning she developed severe pain and swelling in her right leg distal to her knee.  Once it extended above her right knee, she went to the emergency room where she was diagnosed with a DVT.  The day prior, she had bright red blood per rectum presumed to be due to a hemorrhoid per her report.  In the emergency room, they started her on heparin and then discharge the patient home on Xarelto.  She is experienced no GI bleeding since that time.  She denies any melena or hematochezia.  She would like to go ahead and start methotrexate for treatment of psoriatic arthritis however her rheumatologist has recommended that she receive the shingles vaccine prior to starting this.  She would like to receive the shingles vaccine today.  She is already received Pneumovax 23.  At that time, my plan was: Finish Xarelto 15 mg twice daily for a total of 21 days.  Then switch to Xarelto 20 mg a day to complete a total of 6 months.  Therefore in October she will discontinue medication.  Recheck CBC today to ensure no evidence of an occult GI blood loss.  I would like to repeat a CBC in 1 month to monitor her platelet count on medication.  She received her first dose of the shingles vaccine today.  Follow-up with rheumatologist as planned to discuss starting methotrexate.  Recommended scheduling the patient for a bone density test given her use of prednisone and methotrexate.  Recommended 1200 mg a day of calcium and  1000 units a day of vitamin D  02/26/18 As stated above, the patient has been diagnosed with psoriatic arthritis.  She has been started on prednisone 10 mg a day 2 weeks ago and weaned down to 5 mg a day last week.  She is also been started on methotrexate.  Since decreasing the prednisone from 10 to 5 mg, both wrist have become extremely sore painful and swollen.  Both wrists are hot to the touch.  There is edema and effusion from the joint.  She has significant pain with range of motion.  She has significant stiffness.  She is barely able to move her hands in the morning.  She has to soak them in warm water before she can even start to move them.  She has pain with range of motion in her fingers.  She denies any falls or injury. Past Medical History:  Diagnosis Date  . Chronic back pain   . DVT (deep venous thrombosis) (HCC)    right leg after hip replacement and long car ride  . Hyperlipidemia   . Lumbar radiculopathy, right   . Psoriatic arthritis [facility])    Past Surgical History:  Procedure Laterality Date  . abdominal laparoscopy    . APPENDECTOMY  1986  . CHOLECYSTECTOMY  1992  . TOTAL HIP ARTHROPLASTY Right    Current Outpatient Medications on File Prior to Visit  Medication Sig Dispense Refill  .  cetirizine (ZYRTEC) 10 MG tablet Take 1 tablet (10 mg total) by mouth daily. 30 tablet 11  . folic acid (FOLVITE) 1 MG tablet     . methotrexate (RHEUMATREX) 5 MG tablet Take 5 mg by mouth 4 (four) times a week. Caution: Chemotherapy. Protect from light.    . predniSONE (DELTASONE) 5 MG tablet Take 5 mg by mouth daily with breakfast.    . rivaroxaban (XARELTO) 20 MG TABS tablet Take 1 tablet (20 mg total) by mouth daily with supper. 30 tablet 4   No current facility-administered medications on file prior to visit.    Allergies  Allergen Reactions  . Azithromycin Rash   Social History   Socioeconomic History  . Marital status: Married    Spouse name: Not on file  . Number of  children: Not on file  . Years of education: Not on file  . Highest education level: Not on file  Occupational History  . Not on file  Social Needs  . Financial resource strain: Not on file  . Food insecurity:    Worry: Not on file    Inability: Not on file  . Transportation needs:    Medical: Not on file    Non-medical: Not on file  Tobacco Use  . Smoking status: Never Smoker  . Smokeless tobacco: Never Used  Substance and Sexual Activity  . Alcohol use: No  . Drug use: No  . Sexual activity: Not on file  Lifestyle  . Physical activity:    Days per week: Not on file    Minutes per session: Not on file  . Stress: Not on file  Relationships  . Social connections:    Talks on phone: Not on file    Gets together: Not on file    Attends religious service: Not on file    Active member of club or organization: Not on file    Attends meetings of clubs or organizations: Not on file    Relationship status: Not on file  . Intimate partner violence:    Fear of current or ex partner: Not on file    Emotionally abused: Not on file    Physically abused: Not on file    Forced sexual activity: Not on file  Other Topics Concern  . Not on file  Social History Narrative  . Not on file     Review of Systems  All other systems reviewed and are negative.      Objective:   Physical Exam  Constitutional: She appears well-developed and well-nourished.  Neck: Neck supple. No JVD present.  Cardiovascular: Normal rate, regular rhythm and normal heart sounds. Exam reveals no gallop and no friction rub.  No murmur heard. Pulmonary/Chest: Effort normal and breath sounds normal. No respiratory distress. She has no wheezes. She has no rales.  Abdominal: Soft. Bowel sounds are normal. She exhibits no distension. There is no tenderness. There is no rebound and no guarding.  Musculoskeletal: She exhibits no edema.       Right wrist: She exhibits decreased range of motion, tenderness, bony  tenderness, swelling and effusion.       Left wrist: She exhibits decreased range of motion, tenderness, bony tenderness, swelling and effusion.  Lymphadenopathy:    She has no cervical adenopathy.  Vitals reviewed.         Assessment & Plan:  Psoriatic arthritis (HCC)  Continue methotrexate at its current dose.  Increase prednisone to 15 mg a day.  Tentatively plan to  decrease from 15 mg a day to 12.5 mg a day next week if pain has improved.  I would decrease by 2.5 mg every week until she is down to 5 mg a day which is her current dose.  This should be accomplished over a 4-week period of time.  If she continues to see failure to respond to steroids, would consider discussing biologic therapy such as Humira with her rheumatologist.  Patient is unable to use NSAIDs due to increased risk of gastrointestinal bleeding on her Xarelto

## 2018-03-03 ENCOUNTER — Other Ambulatory Visit: Payer: BC Managed Care – PPO

## 2018-03-23 ENCOUNTER — Telehealth: Payer: Self-pay | Admitting: Family Medicine

## 2018-03-23 MED ORDER — PREDNISONE 5 MG PO TABS: 5.0000 mg | ORAL_TABLET | Freq: Two times a day (BID) | ORAL | 0 refills | Status: DC

## 2018-03-23 NOTE — Telephone Encounter (Signed)
Pt called back and she is taking Pred  bid - med sent to pharm

## 2018-03-23 NOTE — Telephone Encounter (Signed)
Pt requesting a refill on prednisone to [facility].

## 2018-03-23 NOTE — Telephone Encounter (Signed)
[facility] - need to know what dose she is currently taking as she was to wean down on mg's.

## 2018-03-25 ENCOUNTER — Encounter: Payer: Self-pay | Admitting: Family Medicine

## 2018-03-25 ENCOUNTER — Ambulatory Visit (INDEPENDENT_AMBULATORY_CARE_PROVIDER_SITE_OTHER): Payer: BC Managed Care – PPO | Admitting: Family Medicine

## 2018-03-25 ENCOUNTER — Ambulatory Visit: Payer: BC Managed Care – PPO | Admitting: Family Medicine

## 2018-03-25 VITALS — BP 120/62 | HR 90 | Temp 99.2°F | Resp 14 | Ht 72.0 in | Wt 187.0 lb

## 2018-03-25 DIAGNOSIS — B070 Plantar wart: Secondary | ICD-10-CM

## 2018-03-25 NOTE — Progress Notes (Signed)
Subjective:    Patient ID: Shelby Clark, female    DOB: [birth year], 62 y.o.   MRN: [id number]  HPI Patient has a painful plantars wart on the plantar surface of her right calcaneus.  Wart is approximately 4 mm in diameter each.  They have been present for several weeks.  It hurts to walk on them.  Each is a hyperkeratotic papule with capillary hemorrhages and tender to the touch. Past Medical History:  Diagnosis Date  . Chronic back pain   . DVT (deep venous thrombosis) (HCC)    right leg after hip replacement and long car ride  . Hyperlipidemia   . Lumbar radiculopathy, right   . Psoriatic arthritis [facility])    Past Surgical History:  Procedure Laterality Date  . abdominal laparoscopy    . APPENDECTOMY  1986  . CHOLECYSTECTOMY  1992  . TOTAL HIP ARTHROPLASTY Right    Current Outpatient Medications on File Prior to Visit  Medication Sig Dispense Refill  . Calcium Carbonate-Vit D-Min (CALCIUM 1200 PO) Take by mouth.    . cetirizine (ZYRTEC) 10 MG tablet Take 1 tablet (10 mg total) by mouth daily. (Patient taking differently: Take 10 mg by mouth daily as needed. ) 30 tablet 11  . Cholecalciferol (VITAMIN D) 2000 units CAPS Take by mouth.    . folic acid (FOLVITE) 1 MG tablet     . magnesium oxide (MAG-OX) 400 MG tablet Take 400 mg by mouth daily.    . methotrexate (RHEUMATREX) 5 MG tablet Take 20 mg by mouth 4 (four) times a week. Caution: Chemotherapy. Protect from light.     . Omega-3 Fatty Acids (OMEGA-3 FISH OIL PO) Take by mouth.    . predniSONE (DELTASONE) 5 MG tablet Take 1 tablet (5 mg total) by mouth 2 (two) times daily with a meal. 60 tablet 0  . rivaroxaban (XARELTO) 20 MG TABS tablet Take 1 tablet (20 mg total) by mouth daily with supper. 30 tablet 4  . Turmeric Curcumin 500 MG CAPS Take by mouth.     No current facility-administered medications on file prior to visit.    Allergies  Allergen Reactions  . Azithromycin Rash   Social History   Socioeconomic History    . Marital status: Married    Spouse name: Not on file  . Number of children: Not on file  . Years of education: Not on file  . Highest education level: Not on file  Occupational History  . Not on file  Social Needs  . Financial resource strain: Not on file  . Food insecurity:    Worry: Not on file    Inability: Not on file  . Transportation needs:    Medical: Not on file    Non-medical: Not on file  Tobacco Use  . Smoking status: Never Smoker  . Smokeless tobacco: Never Used  Substance and Sexual Activity  . Alcohol use: No  . Drug use: No  . Sexual activity: Not on file  Lifestyle  . Physical activity:    Days per week: Not on file    Minutes per session: Not on file  . Stress: Not on file  Relationships  . Social connections:    Talks on phone: Not on file    Gets together: Not on file    Attends religious service: Not on file    Active member of club or organization: Not on file    Attends meetings of clubs or organizations: Not on file  Relationship status: Not on file  . Intimate partner violence:    Fear of current or ex partner: Not on file    Emotionally abused: Not on file    Physically abused: Not on file    Forced sexual activity: Not on file  Other Topics Concern  . Not on file  Social History Narrative  . Not on file     Review of Systems  All other systems reviewed and are negative.      Objective:   Physical Exam  Constitutional: She appears well-developed and well-nourished.  Neck: Neck supple. No JVD present.  Cardiovascular: Normal rate, regular rhythm and normal heart sounds. Exam reveals no gallop and no friction rub.  No murmur heard. Pulmonary/Chest: Effort normal and breath sounds normal. No respiratory distress. She has no wheezes. She has no rales.  Abdominal: Soft. Bowel sounds are normal. She exhibits no distension. There is no tenderness. There is no rebound and no guarding.  Musculoskeletal: She exhibits no edema.   Lymphadenopathy:    She has no cervical adenopathy.  Vitals reviewed.  2 plantar warts on the right heel each approximately 3 to 4 mm in diameter with capillary hemorrhages and hard hyperkeratotic scale       Assessment & Plan:  Plantar wart, right foot  Given her blood thinners, I recommended against excision of the plantar wart due to the risk of bleeding.  Patient would like to try cryotherapy.  Each of the 2 individual plantar warts was treated with 30 seconds of liquid nitrogen cryotherapy.  If lesions persist, would recommend podiatry consultation for excision.  Wound care was discussed.

## 2018-04-06 ENCOUNTER — Other Ambulatory Visit: Payer: Self-pay

## 2018-04-06 ENCOUNTER — Emergency Department (HOSPITAL_COMMUNITY)
Admission: EM | Admit: 2018-04-06 | Discharge: 2018-04-06 | Disposition: A | Discharge status: Home / Self Care | Payer: BC Managed Care – PPO | Attending: Emergency Medicine | Admitting: Emergency Medicine

## 2018-04-06 ENCOUNTER — Encounter (HOSPITAL_COMMUNITY): Payer: Self-pay | Admitting: *Deleted

## 2018-04-06 DIAGNOSIS — Z96641 Presence of right artificial hip joint: Secondary | ICD-10-CM | POA: Insufficient documentation

## 2018-04-06 DIAGNOSIS — M79662 Pain in left lower leg: Secondary | ICD-10-CM | POA: Diagnosis not present

## 2018-04-06 DIAGNOSIS — Z79899 Other long term (current) drug therapy: Secondary | ICD-10-CM | POA: Diagnosis not present

## 2018-04-06 DIAGNOSIS — M79605 Pain in left leg: Secondary | ICD-10-CM

## 2018-04-06 NOTE — ED Provider Notes (Signed)
[facility] EMERGENCY DEPARTMENT Provider Note   CSN: [id number] Arrival date & time: 04/06/18  1827     History   Chief Complaint Chief Complaint  Patient presents with  . Leg Pain    HPI Shelby Clark is a 62 y.o. female.  HPI Patient presents to the emergency room for evaluation of left leg pain.  Patient states she has noticed pain in the left anterior aspect her for her leg for the last week or so.  The pain increases with palpation and movement.  Patient has a history of a DVT in her right lower extremity back in April of this year.  She is currently taking Xarelto.  Patient is worried that she is developing another DVT.  She is planning on going out of town this weekend.  She wanted to make sure she did not have another DVT before leaving.  He denies any chest pain or shortness of breath Past Medical History:  Diagnosis Date  . Chronic back pain   . DVT (deep venous thrombosis) (HCC)    right leg after hip replacement and long car ride  . Hyperlipidemia   . Lumbar radiculopathy, right   . Psoriatic arthritis [facility])     Patient Active Problem List   Diagnosis Date Noted  . DVT (deep venous thrombosis) (HCC)   . DVT (deep vein thrombosis) in pregnancy (HCC) 01/28/2018  . Hyperlipidemia     Past Surgical History:  Procedure Laterality Date  . abdominal laparoscopy    . APPENDECTOMY  1986  . CHOLECYSTECTOMY  1992  . TOTAL HIP ARTHROPLASTY Right      OB History   None      Home Medications    Prior to Admission medications   Medication Sig Start Date End Date Taking? Authorizing Provider  Calcium Carbonate-Vit D-Min (CALCIUM 1200 PO) Take by mouth.    [provider]  cetirizine (ZYRTEC) 10 MG tablet Take 1 tablet (10 mg total) by mouth daily. Patient taking differently: Take 10 mg by mouth daily as needed.  02/04/18   Donita Brooks, MD  Cholecalciferol (VITAMIN D) 2000 units CAPS Take by mouth.    [provider]  folic acid  (FOLVITE) 1 MG tablet  01/27/18   [provider]  magnesium oxide (MAG-OX) 400 MG tablet Take 400 mg by mouth daily.    [provider]  methotrexate (RHEUMATREX) 5 MG tablet Take 20 mg by mouth 4 (four) times a week. Caution: Chemotherapy. Protect from light.     [provider]  Omega-3 Fatty Acids (OMEGA-3 FISH OIL PO) Take by mouth.    [provider]  predniSONE (DELTASONE) 5 MG tablet Take 1 tablet (5 mg total) by mouth 2 (two) times daily with a meal. 03/23/18   Donita Brooks, MD  rivaroxaban (XARELTO) 20 MG TABS tablet Take 1 tablet (20 mg total) by mouth daily with supper. 02/01/18   Donita Brooks, MD  Turmeric Curcumin 500 MG CAPS Take by mouth.    [provider]    Family History Family History  Problem Relation Age of Onset  . Heart disease Father   . Heart disease Brother   . Cancer Brother   . Heart disease Brother     Social History Social History   Tobacco Use  . Smoking status: Never Smoker  . Smokeless tobacco: Never Used  Substance Use Topics  . Alcohol use: No  . Drug use: No  Allergies   Azithromycin   Review of Systems Review of Systems  All other systems reviewed and are negative.    Physical Exam Updated Vital Signs BP (!) 152/98 (BP Location: Right Arm)   Pulse 80   Temp 99 F (37.2 C) (Oral)   Resp 16   Ht 1.829 m (6')   Wt 81.6 kg (180 lb)   SpO2 100%   BMI 24.41 kg/m   Physical Exam  Constitutional: She appears well-developed and well-nourished. No distress.  HENT:  Head: Normocephalic and atraumatic.  Right Ear: External ear normal.  Left Ear: External ear normal.  Eyes: Conjunctivae are normal. Right eye exhibits no discharge. Left eye exhibits no discharge. No scleral icterus.  Neck: Neck supple. No tracheal deviation present.  Cardiovascular: Normal rate.  Pulmonary/Chest: Effort normal. No stridor. No respiratory distress.  Abdominal: She exhibits no distension.    Musculoskeletal: She exhibits tenderness. She exhibits no edema.  Tenderness palpation anterior lateral aspect of the left lower leg, no edema or erythema  Neurological: She is alert. Cranial nerve deficit: no gross deficits.  Skin: Skin is warm and dry. No rash noted.  Psychiatric: She has a normal mood and affect.  Nursing note and vitals reviewed.    ED Treatments / Results    Procedures Procedures (including critical care time)  Medications Ordered in ED Medications - No data to display   Initial Impression / Assessment and Plan / ED Course  I have reviewed the triage vital signs and the nursing notes.  Pertinent labs & imaging results that were available during my care of the patient were reviewed by me and considered in my medical decision making (see chart for details).   Patient is currently on Xarelto.  She has tenderness palpation of the left lower leg.  Her symptoms may be related to shinsplints however she is concerned about the possibly of a recurrent DVT.  Considering her history I think it is reasonable to do a Doppler ultrasound.  Ultrasound is not available at this time.  Patient will return tomorrow to have the study performed.  Final Clinical Impressions(s) / ED Diagnoses   Final diagnoses:  Left leg pain    ED Discharge Orders        Ordered    US Venous Img Lower Unilateral Left     04/06/18 1914       Linwood DibblesKnapp, Clinten Howk, MD 04/06/18 1918

## 2018-04-06 NOTE — Discharge Instructions (Addendum)
Follow up tomorrow to have the DVT ultrasound.  Continue your current medications.  I have attached some information about shin splints.

## 2018-04-06 NOTE — ED Triage Notes (Signed)
Pt c/o pain to top of left lower leg that started right after [facility]. Pt had blood clot in right lower leg in April of this year. No redness or swelling to left lower leg. Pt is on Xarelto.

## 2018-04-07 ENCOUNTER — Ambulatory Visit (HOSPITAL_COMMUNITY)
Admission: RE | Admit: 2018-04-07 | Discharge: 2018-04-07 | Disposition: A | Discharge status: Home / Self Care | Payer: BC Managed Care – PPO | Source: Ambulatory Visit | Attending: Emergency Medicine | Admitting: Emergency Medicine

## 2018-04-07 DIAGNOSIS — M79605 Pain in left leg: Secondary | ICD-10-CM | POA: Insufficient documentation

## 2018-04-07 DIAGNOSIS — I82411 Acute embolism and thrombosis of right femoral vein: Secondary | ICD-10-CM | POA: Insufficient documentation

## 2018-04-07 DIAGNOSIS — M7989 Other specified soft tissue disorders: Secondary | ICD-10-CM | POA: Insufficient documentation

## 2018-05-04 ENCOUNTER — Ambulatory Visit (INDEPENDENT_AMBULATORY_CARE_PROVIDER_SITE_OTHER): Payer: BC Managed Care – PPO | Admitting: Family Medicine

## 2018-05-04 ENCOUNTER — Encounter: Payer: Self-pay | Admitting: Family Medicine

## 2018-05-04 VITALS — BP 126/74 | HR 76 | Temp 98.4°F | Resp 16 | Ht 72.0 in | Wt 185.0 lb

## 2018-05-04 DIAGNOSIS — Z23 Encounter for immunization: Secondary | ICD-10-CM | POA: Diagnosis not present

## 2018-05-04 DIAGNOSIS — B070 Plantar wart: Secondary | ICD-10-CM

## 2018-05-04 NOTE — Addendum Note (Signed)
Addended by: Legrand RamsWILLIS, SANDY B on: 05/04/2018 04:45 PM   Modules accepted: Orders

## 2018-05-04 NOTE — Progress Notes (Signed)
Subjective:    Patient ID: Shelby Clark, female    DOB: Dec 12, 1955, 62 y.o.   MRN: [id number]  HPI  03/25/18 Patient has a painful plantars wart on the plantar surface of her right calcaneus.  Wart is approximately 4 mm in diameter each.  They have been present for several weeks.  It hurts to walk on them.  Each is a hyperkeratotic papule with capillary hemorrhages and tender to the touch.  At that time, my plan was: Given her blood thinners, I recommended against excision of the plantar wart due to the risk of bleeding.  Patient would like to try cryotherapy.  Each of the 2 individual plantar warts was treated with 30 seconds of liquid nitrogen cryotherapy.  If lesions persist, would recommend podiatry consultation for excision.  Wound care was discussed.  05/04/18 Patient is here today because the plantars wart did not go away on the plantar aspect of her right calcaneus.  There are still 2 discrete lesions in the same area that are hyperkeratotic papules with capillary hemorrhages.  We discussed excision today.  The excision process would include using anesthesia in the area followed by a punch biopsy with a 6 mm punch around the effective area, removal of the core/root, and allowing closure by secondary intention.  However the patient states that she is in no pain.  Therefore I have recommended not performing any surgery unless she is having pain due to the risk of complications such as infection.  Patient is actually comfortable with that.  She states that is not hurting. Past Medical History:  Diagnosis Date  . Chronic back pain   . DVT (deep venous thrombosis) (HCC)    right leg after hip replacement and long car ride  . Hyperlipidemia   . Lumbar radiculopathy, right   . Psoriatic arthritis [facility])    Past Surgical History:  Procedure Laterality Date  . abdominal laparoscopy    . APPENDECTOMY  1986  . CHOLECYSTECTOMY  1992  . TOTAL HIP ARTHROPLASTY Right    Current Outpatient  Medications on File Prior to Visit  Medication Sig Dispense Refill  . Calcium Carbonate-Vit D-Min (CALCIUM 1200 PO) Take by mouth.    . cetirizine (ZYRTEC) 10 MG tablet Take 1 tablet (10 mg total) by mouth daily. (Patient taking differently: Take 10 mg by mouth daily as needed. ) 30 tablet 11  . Cholecalciferol (VITAMIN D) 2000 units CAPS Take by mouth.    . folic acid (FOLVITE) 1 MG tablet     . magnesium oxide (MAG-OX) 400 MG tablet Take 400 mg by mouth daily.    . methotrexate (RHEUMATREX) 5 MG tablet Take 20 mg by mouth 4 (four) times a week. Caution: Chemotherapy. Protect from light.     . Omega-3 Fatty Acids (OMEGA-3 FISH OIL PO) Take by mouth.    . predniSONE (DELTASONE) 5 MG tablet Take 1 tablet (5 mg total) by mouth 2 (two) times daily with a meal. 60 tablet 0  . rivaroxaban (XARELTO) 20 MG TABS tablet Take 1 tablet (20 mg total) by mouth daily with supper. 30 tablet 4  . Turmeric Curcumin 500 MG CAPS Take by mouth.     No current facility-administered medications on file prior to visit.    Allergies  Allergen Reactions  . Azithromycin Rash   Social History   Socioeconomic History  . Marital status: Married    Spouse name: Not on file  . Number of children: Not on file  .  Years of education: Not on file  . Highest education level: Not on file  Occupational History  . Not on file  Social Needs  . Financial resource strain: Not on file  . Food insecurity:    Worry: Not on file    Inability: Not on file  . Transportation needs:    Medical: Not on file    Non-medical: Not on file  Tobacco Use  . Smoking status: Never Smoker  . Smokeless tobacco: Never Used  Substance and Sexual Activity  . Alcohol use: No  . Drug use: No  . Sexual activity: Not on file  Lifestyle  . Physical activity:    Days per week: Not on file    Minutes per session: Not on file  . Stress: Not on file  Relationships  . Social connections:    Talks on phone: Not on file    Gets together:  Not on file    Attends religious service: Not on file    Active member of club or organization: Not on file    Attends meetings of clubs or organizations: Not on file    Relationship status: Not on file  . Intimate partner violence:    Fear of current or ex partner: Not on file    Emotionally abused: Not on file    Physically abused: Not on file    Forced sexual activity: Not on file  Other Topics Concern  . Not on file  Social History Narrative  . Not on file     Review of Systems  All other systems reviewed and are negative.      Objective:   Physical Exam  Constitutional: She appears well-developed and well-nourished.  Neck: Neck supple. No JVD present.  Cardiovascular: Normal rate, regular rhythm and normal heart sounds. Exam reveals no gallop and no friction rub.  No murmur heard. Pulmonary/Chest: Effort normal and breath sounds normal. No respiratory distress. She has no wheezes. She has no rales.  Abdominal: Soft. Bowel sounds are normal. She exhibits no distension. There is no tenderness. There is no rebound and no guarding.  Musculoskeletal: She exhibits no edema.  Lymphadenopathy:    She has no cervical adenopathy.  Vitals reviewed.  2 plantar warts on the right heel each approximately 3 to 4 mm in diameter with capillary hemorrhages and hard hyperkeratotic scale       Assessment & Plan:  Plantar wart, right foot Patient elects not to have excision of the plantars wart today given the fact she is not experiencing any pain.  Certainly if she is experiencing pain, we could perform the excision as described in the history of present illness but after discussing informed consent, the patient elects not to proceed with the excision today.  If she changes her mind I will be glad to consult podiatry.  She also had several questions.  #1 she was curious as to when she can have surgery.  I recommended treatment of her DVT with 6 months of Xarelto.  This would place her  October 4.  I have also recommended that she receive Xarelto postoperatively 10 mg daily for 14 days and then having the second knee replacement performed if her surgeon agrees.

## 2018-05-05 ENCOUNTER — Ambulatory Visit: Payer: BC Managed Care – PPO

## 2018-05-27 ENCOUNTER — Other Ambulatory Visit: Payer: Self-pay | Admitting: Family Medicine

## 2018-05-27 MED ORDER — RIVAROXABAN 20 MG PO TABS: 20.0000 mg | ORAL_TABLET | Freq: Every day | ORAL | 4 refills | Status: DC

## 2018-06-29 ENCOUNTER — Telehealth: Payer: Self-pay | Admitting: Family Medicine

## 2018-06-29 NOTE — Telephone Encounter (Signed)
Pt called and would like to know if she can stop her Xarelto 3 days prior to surgery instead of 2 so she can do epidural? She is scheduled for her 1st surgery on 08/02/18 and 2nd surgery scheduled on 08/16/18. Her question is also what day does she need to stop the Xarelto (3 days before surgery day or count surgery day as one day)??? She would like for [place] to send information to her surgeon as well.

## 2018-07-01 NOTE — Telephone Encounter (Signed)
She can stop xarelto 3 days before surgery.  2 days prior to surgery with surgery being day 3 is the typical recommendation.  If they request 3 days before surgery and surgery being day 4 to do epidural, I believe the increase risk would be minimal.  Let me know what I need to do.

## 2018-07-12 NOTE — Telephone Encounter (Signed)
Pt aware of plan and faxed rx for new Xarelto order.

## 2018-08-02 ENCOUNTER — Other Ambulatory Visit: Payer: Self-pay | Admitting: Family Medicine

## 2018-08-02 ENCOUNTER — Telehealth: Payer: Self-pay | Admitting: Family Medicine

## 2018-08-02 DIAGNOSIS — I824Y1 Acute embolism and thrombosis of unspecified deep veins of right proximal lower extremity: Secondary | ICD-10-CM

## 2018-08-02 NOTE — Telephone Encounter (Signed)
Pt's husband called and wanted to know if the pt needed to have a follow up scan for her blood clot?

## 2018-08-02 NOTE — Telephone Encounter (Signed)
Pt's husband has called back and states that the surgeon will not do her 2nd surgery until the scan has been done on her right leg for the blood clot. She needs to have this done prior to 08/13/18.

## 2018-08-02 NOTE — Telephone Encounter (Signed)
I ordered. Please Carollee Herter try to expedite.

## 2018-08-03 ENCOUNTER — Other Ambulatory Visit: Payer: Self-pay | Admitting: Family Medicine

## 2018-08-03 DIAGNOSIS — I824Y1 Acute embolism and thrombosis of unspecified deep veins of right proximal lower extremity: Secondary | ICD-10-CM

## 2018-08-03 NOTE — Telephone Encounter (Signed)
Called and had vascular ultrasound scheduled for 08/04/18 at [facility] as it was ordered. Patient informed me that she wanted to go to [facility] instead of [place]. Ultrasound ordered as a US venous as this is how they perform these ultrasounds at [facility]. Patient is now scheduled for this Friday 08/06/18 at 2:30 PM. Patient was notified of appointment.

## 2018-08-04 ENCOUNTER — Encounter (HOSPITAL_COMMUNITY): Payer: BC Managed Care – PPO

## 2018-08-06 ENCOUNTER — Other Ambulatory Visit: Payer: Self-pay | Admitting: Family Medicine

## 2018-08-06 ENCOUNTER — Ambulatory Visit (HOSPITAL_COMMUNITY)
Admission: RE | Admit: 2018-08-06 | Discharge: 2018-08-06 | Disposition: A | Discharge status: Home / Self Care | Payer: BC Managed Care – PPO | Source: Ambulatory Visit | Attending: Family Medicine | Admitting: Family Medicine

## 2018-08-06 DIAGNOSIS — I824Y1 Acute embolism and thrombosis of unspecified deep veins of right proximal lower extremity: Secondary | ICD-10-CM

## 2018-10-21 ENCOUNTER — Ambulatory Visit (INDEPENDENT_AMBULATORY_CARE_PROVIDER_SITE_OTHER): Payer: BC Managed Care – PPO | Admitting: Family Medicine

## 2018-10-21 ENCOUNTER — Other Ambulatory Visit: Payer: Self-pay

## 2018-10-21 ENCOUNTER — Encounter: Payer: Self-pay | Admitting: Family Medicine

## 2018-10-21 VITALS — BP 128/66 | HR 90 | Temp 97.9°F | Resp 14 | Ht 72.0 in | Wt 171.0 lb

## 2018-10-21 DIAGNOSIS — Z780 Asymptomatic menopausal state: Secondary | ICD-10-CM | POA: Diagnosis not present

## 2018-10-21 DIAGNOSIS — L4050 Arthropathic psoriasis, unspecified: Secondary | ICD-10-CM

## 2018-10-21 DIAGNOSIS — Z23 Encounter for immunization: Secondary | ICD-10-CM

## 2018-10-21 DIAGNOSIS — Z0000 Encounter for general adult medical examination without abnormal findings: Secondary | ICD-10-CM | POA: Diagnosis not present

## 2018-10-21 DIAGNOSIS — Z1239 Encounter for other screening for malignant neoplasm of breast: Secondary | ICD-10-CM

## 2018-10-21 MED ORDER — TRAZODONE HCL 50 MG PO TABS: 50.0000 mg | ORAL_TABLET | Freq: Every evening | ORAL | 3 refills | Status: AC | PRN

## 2018-10-21 NOTE — Progress Notes (Signed)
Subjective:    Patient ID: Shelby Clark, female    DOB: October 01, 1956, 62 y.o.   MRN: [id number]  HPI Patient is a very pleasant 62 year old Caucasian female here today for complete physical exam.  She has had a very eventful year.  She was diagnosed with a DVT after a trip to [place] and has completed 6 months of Xarelto.  In October she underwent knee replacement.  She had her other knee replaced a few weeks after that.  Has had no residual problems after the knee replacement other than insomnia.  She has a difficult time falling asleep at night due to some residual pain and warmth in her knees.  She is interested in something to try to help her sleep.  She is not able to sleep in her own bed due to tenderness in her knees when she lays on her side.  She is due for mammogram.  Her last colonoscopy was 7 years ago and is not due again for 3 years.  Her last Pap smear was 2 years ago and is not due again until next year.  The bone density test in April that was outstanding.  She recently had lab work at her rheumatologist due to her underlying autoimmune disease and her CBC and CMP were normal outside of low calcium.  She also has a history of low vitamin D in the past and would like this checked again. Past Medical History:  Diagnosis Date  . Chronic back pain   . DVT (deep venous thrombosis) (HCC)    right leg after hip replacement and long car ride  . Hyperlipidemia   . Lumbar radiculopathy, right   . Psoriatic arthritis [facility])    Past Surgical History:  Procedure Laterality Date  . abdominal laparoscopy    . APPENDECTOMY  1986  . CHOLECYSTECTOMY  1992  . KNEE RECONSTRUCTION Bilateral   . TOTAL HIP ARTHROPLASTY Right    Current Outpatient Medications on File Prior to Visit  Medication Sig Dispense Refill  . Calcium Carbonate-Vit D-Min (CALCIUM 1200 PO) Take by mouth.    . cetirizine (ZYRTEC) 10 MG tablet Take 1 tablet (10 mg total) by mouth daily. (Patient taking differently: Take 10 mg  by mouth daily as needed. ) 30 tablet 11  . folic acid (FOLVITE) 1 MG tablet     . HYDROcodone-acetaminophen (NORCO/VICODIN) 5-325 MG tablet Take 1 tablet by mouth every 6 (six) hours as needed for moderate pain.    . magnesium oxide (MAG-OX) 400 MG tablet Take 400 mg by mouth daily.    . methocarbamol (ROBAXIN) 500 MG tablet Take 500 mg by mouth 4 (four) times daily.    . methotrexate (RHEUMATREX) 5 MG tablet Take 20 mg by mouth 4 (four) times a week. Caution: Chemotherapy. Protect from light.     . predniSONE (DELTASONE) 5 MG tablet Take 1 tablet (5 mg total) by mouth 2 (two) times daily with a meal. 60 tablet 0  . Cholecalciferol (VITAMIN D) 2000 units CAPS Take by mouth.     No current facility-administered medications on file prior to visit.    Allergies  Allergen Reactions  . Azithromycin Rash   Social History   Socioeconomic History  . Marital status: Married    Spouse name: Not on file  . Number of children: Not on file  . Years of education: Not on file  . Highest education level: Not on file  Occupational History  . Not on file  Social Needs  .  Financial resource strain: Not on file  . Food insecurity:    Worry: Not on file    Inability: Not on file  . Transportation needs:    Medical: Not on file    Non-medical: Not on file  Tobacco Use  . Smoking status: Never Smoker  . Smokeless tobacco: Never Used  Substance and Sexual Activity  . Alcohol use: No  . Drug use: No  . Sexual activity: Not on file  Lifestyle  . Physical activity:    Days per week: Not on file    Minutes per session: Not on file  . Stress: Not on file  Relationships  . Social connections:    Talks on phone: Not on file    Gets together: Not on file    Attends religious service: Not on file    Active member of club or organization: Not on file    Attends meetings of clubs or organizations: Not on file    Relationship status: Not on file  . Intimate partner violence:    Fear of current or  ex partner: Not on file    Emotionally abused: Not on file    Physically abused: Not on file    Forced sexual activity: Not on file  Other Topics Concern  . Not on file  Social History Narrative  . Not on file   Family History  Problem Relation Age of Onset  . Heart disease Father   . Heart disease Brother   . Cancer Brother   . Heart disease Brother       Review of Systems  All other systems reviewed and are negative.      Objective:   Physical Exam Vitals signs reviewed.  Constitutional:      General: She is not in acute distress.    Appearance: Normal appearance. She is not ill-appearing, toxic-appearing or diaphoretic.  HENT:     Head: Normocephalic and atraumatic.     Right Ear: Tympanic membrane, ear canal and external ear normal.     Left Ear: Tympanic membrane, ear canal and external ear normal.     Nose: Nose normal. No congestion or rhinorrhea.     Mouth/Throat:     Mouth: Mucous membranes are moist.     Pharynx: No oropharyngeal exudate or posterior oropharyngeal erythema.  Eyes:     General: No scleral icterus.       Right eye: No discharge.        Left eye: No discharge.     Conjunctiva/sclera: Conjunctivae normal.     Pupils: Pupils are equal, round, and reactive to light.  Neck:     Musculoskeletal: Normal range of motion. No neck rigidity or muscular tenderness.     Vascular: No carotid bruit.  Cardiovascular:     Rate and Rhythm: Normal rate and regular rhythm.     Pulses: Normal pulses.     Heart sounds: No murmur. No friction rub. No gallop.   Pulmonary:     Effort: Pulmonary effort is normal. No respiratory distress.     Breath sounds: Normal breath sounds. No stridor. No wheezing, rhonchi or rales.  Chest:     Chest wall: No tenderness.  Abdominal:     General: Bowel sounds are normal. There is no distension.     Palpations: There is no mass.     Tenderness: There is no abdominal tenderness. There is no right CVA tenderness, left CVA  tenderness, guarding or rebound.  Hernia: No hernia is present.  Musculoskeletal:     Right lower leg: No edema.     Left lower leg: No edema.  Lymphadenopathy:     Cervical: No cervical adenopathy.  Skin:    Coloration: Skin is not jaundiced or pale.     Findings: No bruising, erythema, lesion or rash.  Neurological:     General: No focal deficit present.     Mental Status: She is alert and oriented to person, place, and time. Mental status is at baseline.     Cranial Nerves: No cranial nerve deficit.     Sensory: No sensory deficit.     Motor: No weakness.     Coordination: Coordination normal.     Gait: Gait normal.  Psychiatric:        Mood and Affect: Mood normal.        Behavior: Behavior normal.        Thought Content: Thought content normal.        Judgment: Judgment normal.           Assessment & Plan:  Need for immunization against influenza - Plan: Flu Vaccine QUAD 36+ mos IM, Lipid panel, VITAMIN D 25 Hydroxy (Vit-D Deficiency, Fractures) Gen med exam Patient received her flu shot today.  Otherwise her immunizations are up-to-date as referenced below: Immunization History  Administered Date(s) Administered  . Influenza,inj,Quad PF,6+ Mos 10/21/2018  . Influenza-Unspecified 11/10/2016, 07/27/2017  . Pneumococcal Polysaccharide-23 01/26/2018  . Tdap 08/18/2016  . Zoster Recombinat (Shingrix) 02/01/2018, 05/04/2018   Colonoscopy is not due again for 3 years.  She is due for mammogram.  Pap smear is due next year.  She is not due for a bone density test for another 5 years.  I will schedule her for mammogram.  I will check a fasting lipid panel and a vitamin D level.  I have encouraged 1200 mg a day of calcium and 1000 units a day of vitamin D.  I will give her trazodone 50 mg p.o. nightly as needed insomnia.

## 2018-10-22 ENCOUNTER — Encounter: Payer: Self-pay | Admitting: *Deleted

## 2018-10-22 LAB — LIPID PANEL
CHOL/HDL RATIO: 3.3 (calc) (ref <5.0)
CHOLESTEROL: 176 mg/dL (ref <200)
HDL: 53 mg/dL (ref >50)
LDL CHOLESTEROL (CALC): 104 mg/dL — AB
Non-HDL Cholesterol (Calc): 123 mg/dL (calc) (ref <130)
TRIGLYCERIDES: 92 mg/dL (ref <150)

## 2018-10-22 LAB — VITAMIN D 25 HYDROXY (VIT D DEFICIENCY, FRACTURES): Vit D, 25-Hydroxy: 26 ng/mL — ABNORMAL LOW (ref 30–100)

## 2018-10-22 MED ORDER — VITAMIN D (ERGOCALCIFEROL) 1.25 MG (50000 UNIT) PO CAPS: 50000.0000 [IU] | ORAL_CAPSULE | ORAL | 1 refills | Status: DC

## 2018-11-19 ENCOUNTER — Ambulatory Visit: Payer: BC Managed Care – PPO | Admitting: Family Medicine

## 2018-11-19 ENCOUNTER — Encounter: Payer: Self-pay | Admitting: Family Medicine

## 2018-11-19 VITALS — BP 100/60 | HR 72 | Temp 98.1°F | Resp 15 | Wt 171.5 lb

## 2018-11-19 DIAGNOSIS — R319 Hematuria, unspecified: Secondary | ICD-10-CM | POA: Diagnosis not present

## 2018-11-19 DIAGNOSIS — N390 Urinary tract infection, site not specified: Secondary | ICD-10-CM | POA: Diagnosis not present

## 2018-11-19 LAB — URINALYSIS, ROUTINE W REFLEX MICROSCOPIC
Bilirubin Urine: NEGATIVE
GLUCOSE, UA: NEGATIVE
Nitrite: NEGATIVE
Specific Gravity, Urine: 1.02 (ref 1.001–1.03)
pH: 7.5 (ref 5.0–8.0)

## 2018-11-19 LAB — MICROSCOPIC MESSAGE

## 2018-11-19 MED ORDER — CEPHALEXIN 500 MG PO CAPS: 500.0000 mg | ORAL_CAPSULE | Freq: Four times a day (QID) | ORAL | 0 refills | Status: AC

## 2018-11-19 MED ORDER — PHENAZOPYRIDINE HCL 100 MG PO TABS: 100.0000 mg | ORAL_TABLET | Freq: Three times a day (TID) | ORAL | 0 refills | Status: AC | PRN

## 2018-11-19 NOTE — Progress Notes (Signed)
Patient ID: Shelby Clark, female    DOB: 1955/10/30, 63 y.o.   MRN: [id number]  PCP: Donita BrooksPickard, Warren T, MD  Chief Complaint  Patient presents with  . Urinary Tract Infection    Patient in today with c/o urinary frequency    Subjective:   Shelby Clark is a 63 y.o. female, presents to clinic with urinary complaints Urinary Tract Infection   This is a new problem. The current episode started yesterday. The problem occurs every urination. The problem has been gradually worsening. The patient is experiencing no pain. There has been no fever. There is no history of pyelonephritis. Associated symptoms include frequency and urgency. Pertinent negatives include no chills, flank pain, hematuria, hesitancy, nausea, sweats or vomiting. Associated symptoms comments: Frequency every 10 min, small urine volume, some incontinence which is new. She has tried nothing for the symptoms. The treatment provided no relief. There is no history of catheterization, kidney stones, recurrent UTIs, a single kidney, urinary stasis or a urological procedure.  No history of UTI.  Patient denies any hematuria.  No abdominal pain, flank pain, decreased appetite, confusion, lightheadedness, balance issues. She is on methotrexate.  Otherwise no immune modulators, no diabetes   Patient Active Problem List   Diagnosis Date Noted  . DVT (deep venous thrombosis) (HCC)   . DVT (deep vein thrombosis) in pregnancy 01/28/2018  . Hyperlipidemia      Prior to Admission medications   Medication Sig Start Date End Date Taking? Authorizing Provider  Calcium Carbonate-Vit D-Min (CALCIUM 1200 PO) Take by mouth.   Yes [provider]  Cholecalciferol (VITAMIN D) 2000 units CAPS Take by mouth.   Yes [provider]  folic acid (FOLVITE) 1 MG tablet  01/27/18  Yes [provider]  methotrexate (RHEUMATREX) 5 MG tablet Take 20 mg by mouth 4 (four) times a week. Caution: Chemotherapy. Protect from light.     Yes [provider]  cephALEXin (KEFLEX) 500 MG capsule Take 1 capsule (500 mg total) by mouth 4 (four) times daily for 5 days. 11/19/18 11/24/18  Danelle Berryapia, Bruce Churilla, PA-C  cetirizine (ZYRTEC) 10 MG tablet Take 1 tablet (10 mg total) by mouth daily. Patient taking differently: Take 10 mg by mouth daily as needed.  02/04/18   Donita BrooksPickard, Warren T, MD  magnesium oxide (MAG-OX) 400 MG tablet Take 400 mg by mouth daily.    [provider]  phenazopyridine (PYRIDIUM) 100 MG tablet Take 1 tablet (100 mg total) by mouth 3 (three) times daily as needed for pain. 11/19/18   Danelle Berryapia, Torrey Ballinas, PA-C  traZODone (DESYREL) 50 MG tablet Take 1 tablet (50 mg total) by mouth at bedtime as needed for sleep. Patient not taking: Reported on 11/19/2018 10/21/18   Donita BrooksPickard, Warren T, MD  Vitamin D, Ergocalciferol, (DRISDOL) 1.25 MG (50000 UT) CAPS capsule Take 1 capsule (50,000 Units total) by mouth every 7 (seven) days. x6 months 10/22/18   Donita BrooksPickard, Warren T, MD     Allergies  Allergen Reactions  . Azithromycin Rash     Family History  Problem Relation Age of Onset  . Heart disease Father   . Heart disease Brother   . Cancer Brother   . Heart disease Brother      Social History   Socioeconomic History  . Marital status: Married    Spouse name: Not on file  . Number of children: Not on file  . Years of education: Not on file  . Highest education level: Not on  file  Occupational History  . Not on file  Social Needs  . Financial resource strain: Not on file  . Food insecurity:    Worry: Not on file    Inability: Not on file  . Transportation needs:    Medical: Not on file    Non-medical: Not on file  Tobacco Use  . Smoking status: Never Smoker  . Smokeless tobacco: Never Used  Substance and Sexual Activity  . Alcohol use: No  . Drug use: No  . Sexual activity: Not on file  Lifestyle  . Physical activity:    Days per week: Not on file    Minutes per session: Not on file  . Stress: Not on  file  Relationships  . Social connections:    Talks on phone: Not on file    Gets together: Not on file    Attends religious service: Not on file    Active member of club or organization: Not on file    Attends meetings of clubs or organizations: Not on file    Relationship status: Not on file  . Intimate partner violence:    Fear of current or ex partner: Not on file    Emotionally abused: Not on file    Physically abused: Not on file    Forced sexual activity: Not on file  Other Topics Concern  . Not on file  Social History Narrative  . Not on file     Review of Systems  Constitutional: Negative.  Negative for chills.  HENT: Negative.   Eyes: Negative.   Respiratory: Negative.   Cardiovascular: Negative.   Gastrointestinal: Negative.  Negative for nausea and vomiting.  Endocrine: Negative.   Genitourinary: Positive for frequency and urgency. Negative for flank pain, hematuria and hesitancy.  Musculoskeletal: Negative.   Skin: Negative.   Allergic/Immunologic: Negative.   Neurological: Negative.   Hematological: Negative.   Psychiatric/Behavioral: Negative.   All other systems reviewed and are negative.      Objective:    Vitals:   11/19/18 1458  BP: 100/60  Pulse: 72  Resp: 15  Temp: 98.1 F (36.7 C)  TempSrc: Oral  SpO2: 97%  Weight: 171 lb 8 oz (77.8 kg)      Physical Exam Vitals signs and nursing note reviewed.  Constitutional:      General: She is not in acute distress.    Appearance: Normal appearance. She is well-developed. She is not ill-appearing, toxic-appearing or diaphoretic.  HENT:     Head: Normocephalic and atraumatic.     Right Ear: External ear normal.     Left Ear: External ear normal.     Nose: Nose normal.     Mouth/Throat:     Mouth: Mucous membranes are moist.     Pharynx: Oropharynx is clear. Uvula midline.  Eyes:     General: Lids are normal.     Conjunctiva/sclera: Conjunctivae normal.     Pupils: Pupils are equal,  round, and reactive to light.  Neck:     Musculoskeletal: Neck supple.     Trachea: Phonation normal. No tracheal deviation.  Cardiovascular:     Rate and Rhythm: Normal rate and regular rhythm.     Pulses: Normal pulses.          Radial pulses are 2+ on the right side and 2+ on the left side.       Posterior tibial pulses are 2+ on the right side and 2+ on the left side.  Heart sounds: Normal heart sounds. No murmur. No friction rub. No gallop.   Pulmonary:     Effort: Pulmonary effort is normal. No respiratory distress.     Breath sounds: Normal breath sounds. No stridor. No wheezing, rhonchi or rales.  Chest:     Chest wall: No tenderness.  Abdominal:     General: Bowel sounds are normal. There is no distension.     Palpations: Abdomen is soft.     Tenderness: There is no abdominal tenderness. There is no right CVA tenderness, left CVA tenderness, guarding or rebound.  Lymphadenopathy:     Cervical: No cervical adenopathy.  Skin:    General: Skin is warm and dry.     Capillary Refill: Capillary refill takes less than 2 seconds.     Coloration: Skin is not pale.     Findings: No rash.  Neurological:     Mental Status: She is alert and oriented to person, place, and time.     Motor: No abnormal muscle tone.     Gait: Gait abnormal.  Psychiatric:        Speech: Speech normal.        Behavior: Behavior normal.            Assessment & Plan:      ICD-10-CM   1. Urinary tract infection with hematuria, site unspecified N39.0 Urinalysis, Routine w reflex microscopic   R31.9 Urine Culture    Urine cloudy, Dip positive - not enough urine to do microscopy and culture - so running culture and treating with keflex. Pt is well appearing, no constitutional sx, push fluids, abx, pyridium prn Follow up if not improving  Danelle BerryLeisa Kordell Jafri, PA-C 11/19/18 3:28 PM

## 2018-11-19 NOTE — Patient Instructions (Signed)
Urinary Tract Infection, Adult A urinary tract infection (UTI) is an infection of any part of the urinary tract. The urinary tract includes:  The kidneys.  The ureters.  The bladder.  The urethra. These organs make, store, and get rid of pee (urine) in the body. What are the causes? This is caused by germs (bacteria) in your genital area. These germs grow and cause swelling (inflammation) of your urinary tract. What increases the risk? You are more likely to develop this condition if:  You have a small, thin tube (catheter) to drain pee.  You cannot control when you pee or poop (incontinence).  You are female, and: ? You use these methods to prevent pregnancy: ? A medicine that kills sperm (spermicide). ? A device that blocks sperm (diaphragm). ? You have low levels of a female hormone (estrogen). ? You are pregnant.  You have genes that add to your risk.  You are sexually active.  You take antibiotic medicines.  You have trouble peeing because of: ? A prostate that is bigger than normal, if you are female. ? A blockage in the part of your body that drains pee from the bladder (urethra). ? A kidney stone. ? A nerve condition that affects your bladder (neurogenic bladder). ? Not getting enough to drink. ? Not peeing often enough.  You have other conditions, such as: ? Diabetes. ? A weak disease-fighting system (immune system). ? Sickle cell disease. ? Gout. ? Injury of the spine. What are the signs or symptoms? Symptoms of this condition include:  Needing to pee right away (urgently).  Peeing often.  Peeing small amounts often.  Pain or burning when peeing.  Blood in the pee.  Pee that smells bad or not like normal.  Trouble peeing.  Pee that is cloudy.  Fluid coming from the vagina, if you are female.  Pain in the belly or lower back. Other symptoms include:  Throwing up (vomiting).  No urge to eat.  Feeling mixed up (confused).  Being tired  and grouchy (irritable).  A fever.  Watery poop (diarrhea). How is this treated? This condition may be treated with:  Antibiotic medicine.  Other medicines.  Drinking enough water. Follow these instructions at home:  Medicines  Take over-the-counter and prescription medicines only as told by your doctor.  If you were prescribed an antibiotic medicine, take it as told by your doctor. Do not stop taking it even if you start to feel better. General instructions  Make sure you: ? Pee until your bladder is empty. ? Do not hold pee for a long time. ? Empty your bladder after sex. ? Wipe from front to back after pooping if you are a female. Use each tissue one time when you wipe.  Drink enough fluid to keep your pee pale yellow.  Keep all follow-up visits as told by your doctor. This is important. Contact a doctor if:  You do not get better after 1-2 days.  Your symptoms go away and then come back. Get help right away if:  You have very bad back pain.  You have very bad pain in your lower belly.  You have a fever.  You are sick to your stomach (nauseous).  You are throwing up. Summary  A urinary tract infection (UTI) is an infection of any part of the urinary tract.  This condition is caused by germs in your genital area.  There are many risk factors for a UTI. These include having a small, thin   tube to drain pee and not being able to control when you pee or poop.  Treatment includes antibiotic medicines for germs.  Drink enough fluid to keep your pee pale yellow. This information is not intended to replace advice given to you by your health care provider. Make sure you discuss any questions you have with your health care provider. Document Released: 03/31/2008 Document Revised: 04/22/2018 Document Reviewed: 04/22/2018 Elsevier Interactive Patient Education  2019 [employer]  

## 2018-11-21 LAB — URINE CULTURE
MICRO NUMBER:: 101586
SPECIMEN QUALITY:: ADEQUATE

## 2019-01-07 ENCOUNTER — Other Ambulatory Visit: Payer: Self-pay | Admitting: Family Medicine

## 2019-02-26 IMAGING — US US EXTREM LOW VENOUS*R*
1 series · 14 of 24 positions shown · non-contrast
Comparison: None.

CLINICAL DATA: Right hip arthroplasty. Right lower extremity
swelling.

EXAM:
RIGHT LOWER EXTREMITY VENOUS DUPLEX ULTRASOUND
TECHNIQUE: Doppler venous assessment of the right lower extremity deep venous
system was performed, including characterization of spectral flow,
compressibility, and phasicity.

[Series 1: us extrem low venous*right* · 0.08mm/px · 14 of 38 slices shown]
[im 1/38]
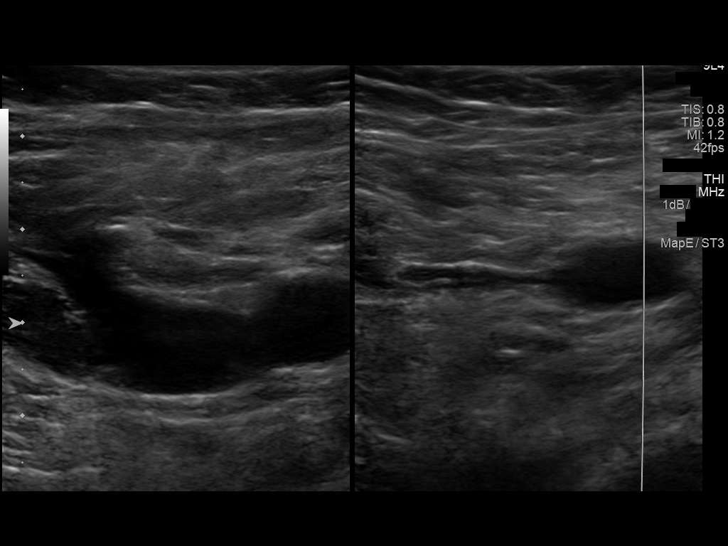
[im 4/38]
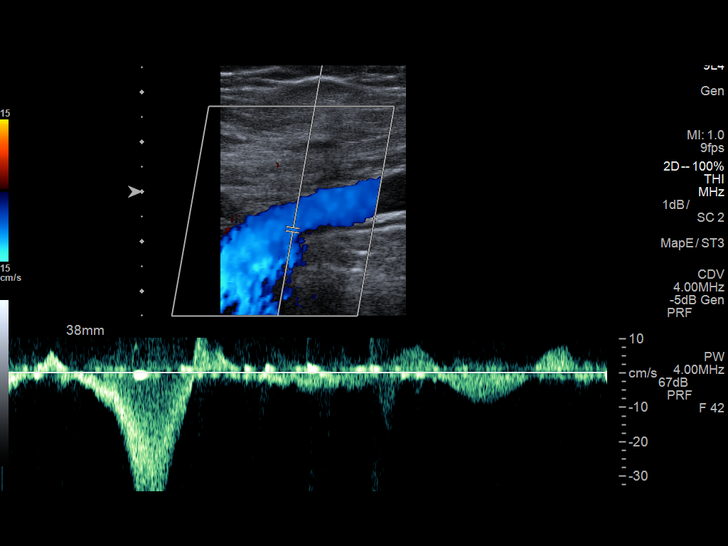
[im 7/38]
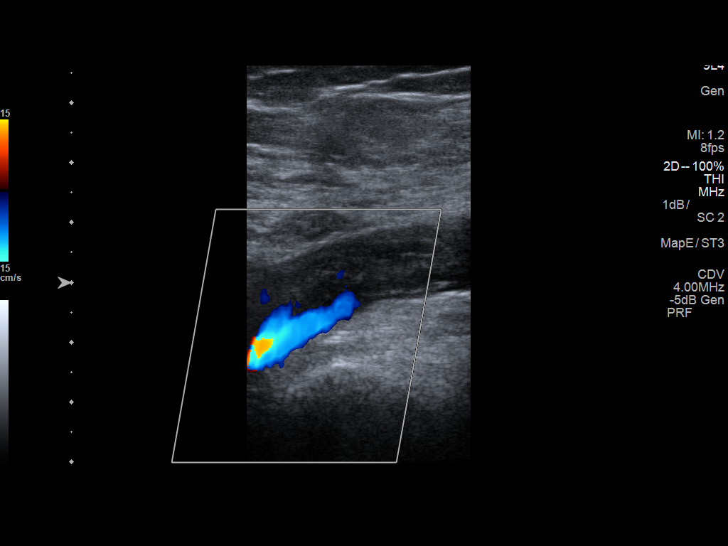
[im 10/38]
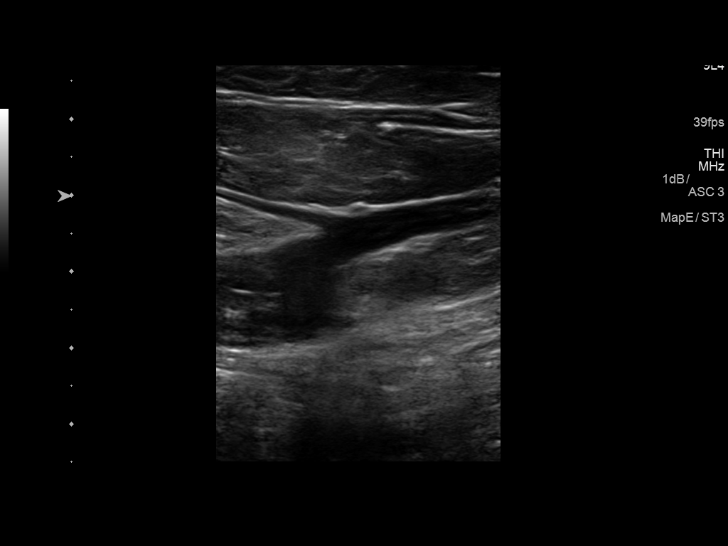
[im 12/38]
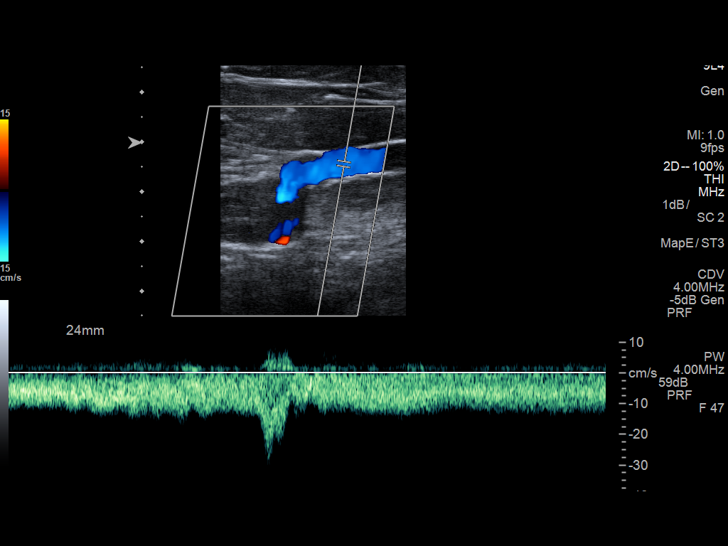
[im 15/38]
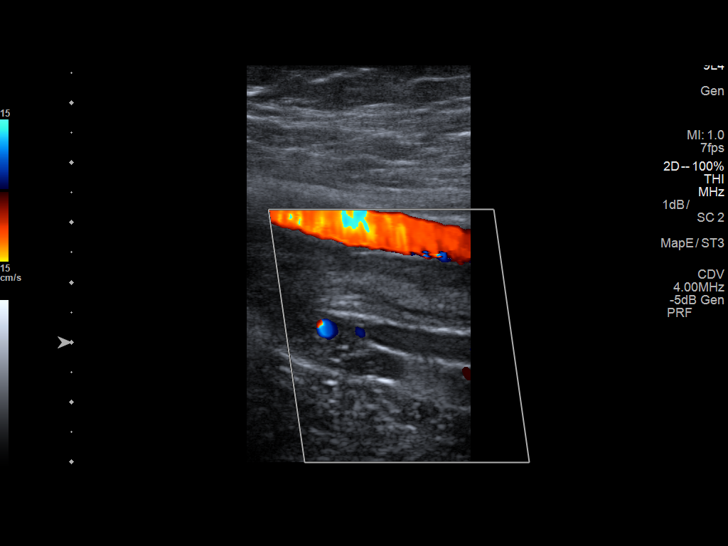
[im 18/38]
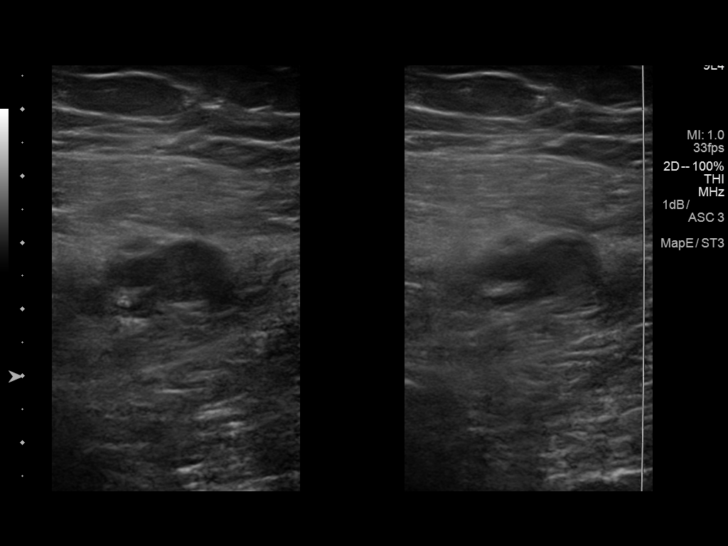
[im 20/38]
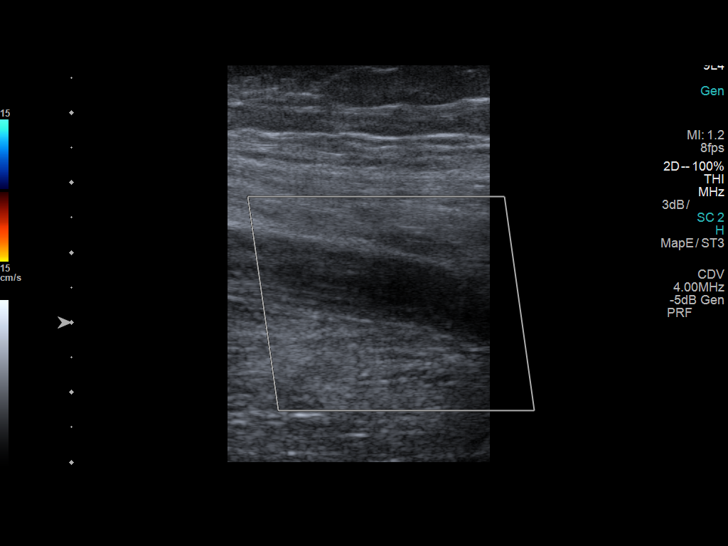
[im 23/38]
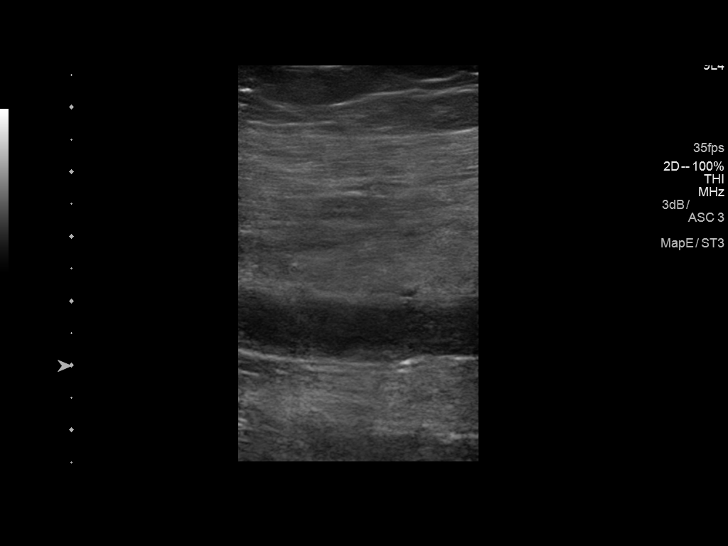
[im 26/38]
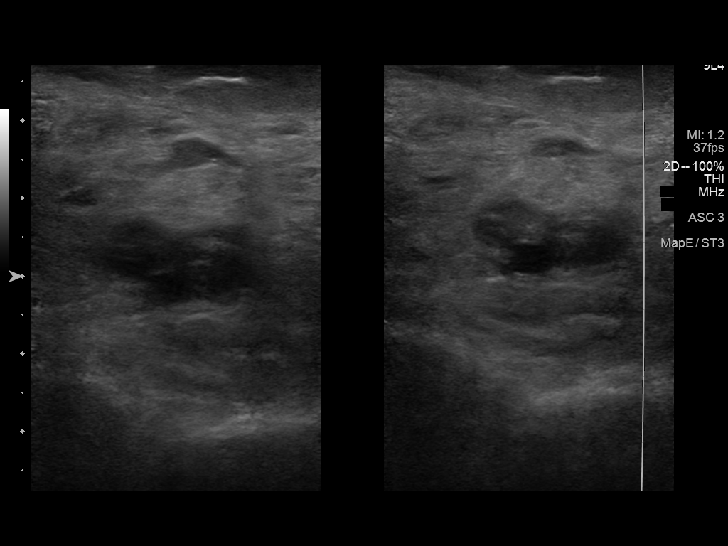
[im 29/38]
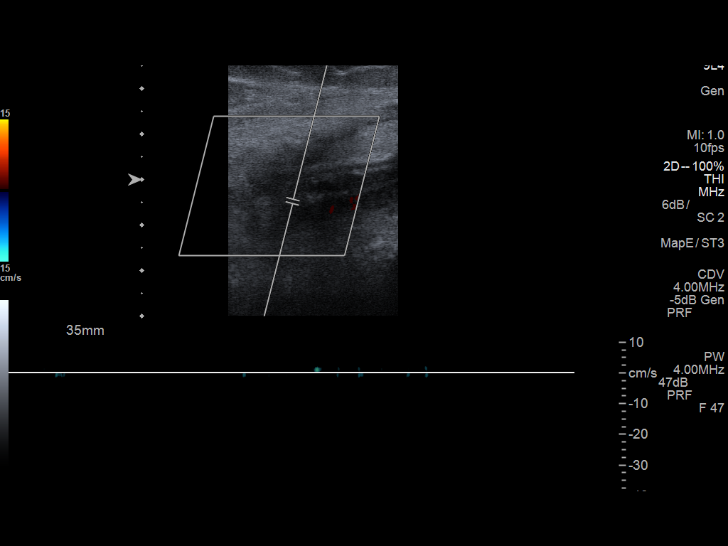
[im 31/38]
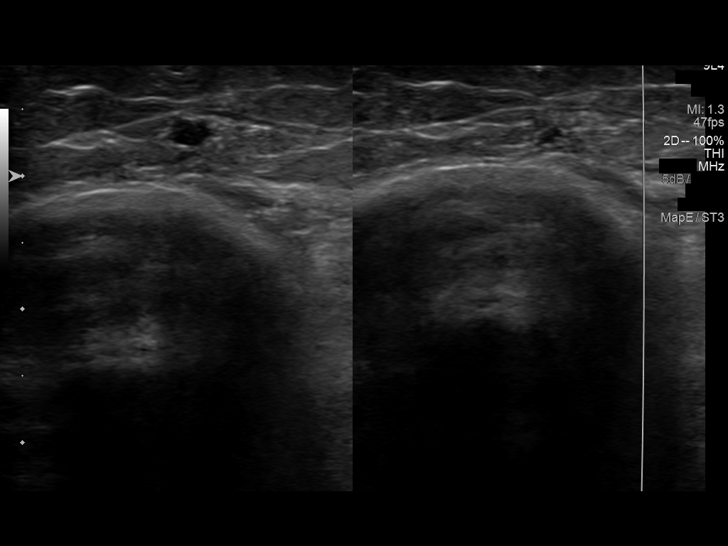
[im 34/38]
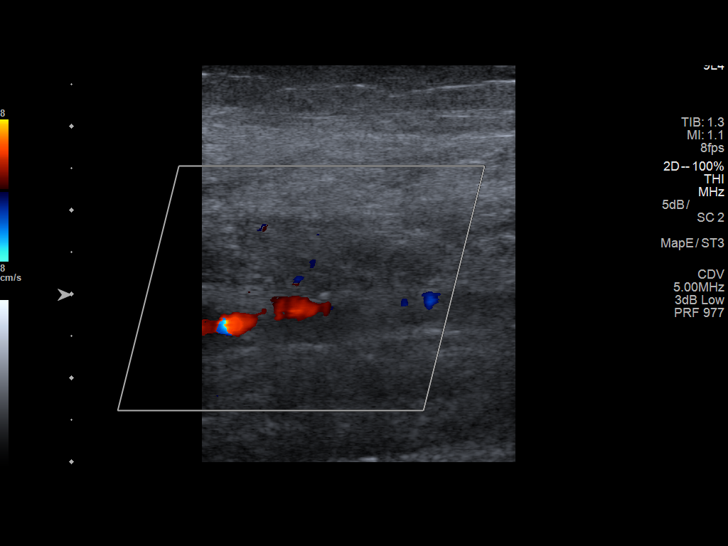
[im 38/38]
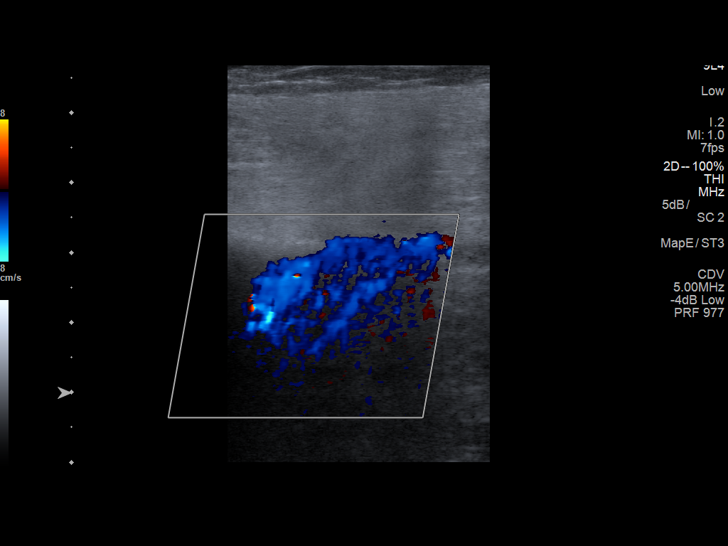

[14 of 24 positions shown; findings below may reference images not displayed]

FINDINGS: The right common femoral, femoral, profunda femoral, and popliteal
veins are noncompressible. They contain occlusive thrombus. There is
also occlusive thrombus in the posterior tibial and peroneal veins.
IMPRESSION: There is extensive occlusive DVT throughout the right lower
extremity as described. Critical Value/emergent results were called
by telephone at the time of interpretation on 01/28/2018 at [DATE] to
Dr. GERTRUDIS GOOSE , who verbally acknowledged these results.

## 2019-06-20 ENCOUNTER — Other Ambulatory Visit: Payer: Self-pay | Admitting: Family Medicine

## 2019-08-08 ENCOUNTER — Other Ambulatory Visit: Payer: Self-pay

## 2019-08-08 DIAGNOSIS — Z20822 Contact with and (suspected) exposure to covid-19: Secondary | ICD-10-CM

## 2019-08-09 LAB — NOVEL CORONAVIRUS, NAA: SARS-CoV-2, NAA: NOT DETECTED

## 2019-09-04 IMAGING — US US EXTREM LOW VENOUS BILAT
1 series · 13 of 24 positions shown · non-contrast
Comparison: None.

CLINICAL DATA: History of right lower extremity DVT. Patient is
currently on anticoagulation. Evaluate for acute or chronic DVT.



[Series 1: us extrem low venous bilat · 0.08mm/px · 13 of 78 slices shown]
[im 1/78]
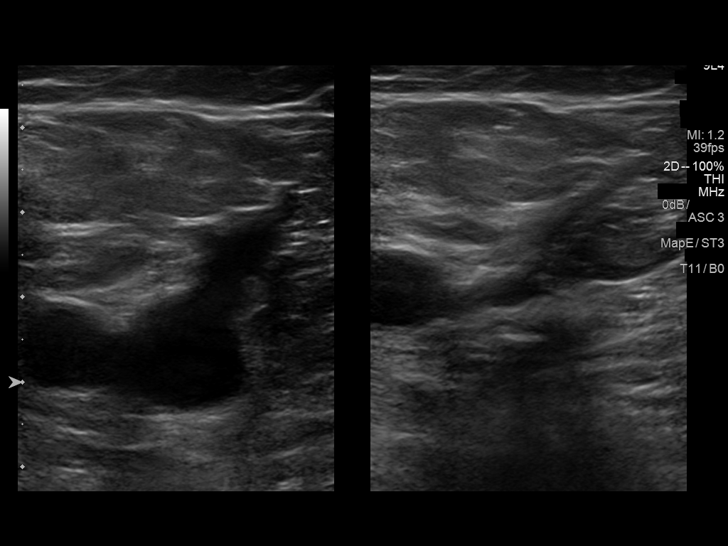
[im 7/78]
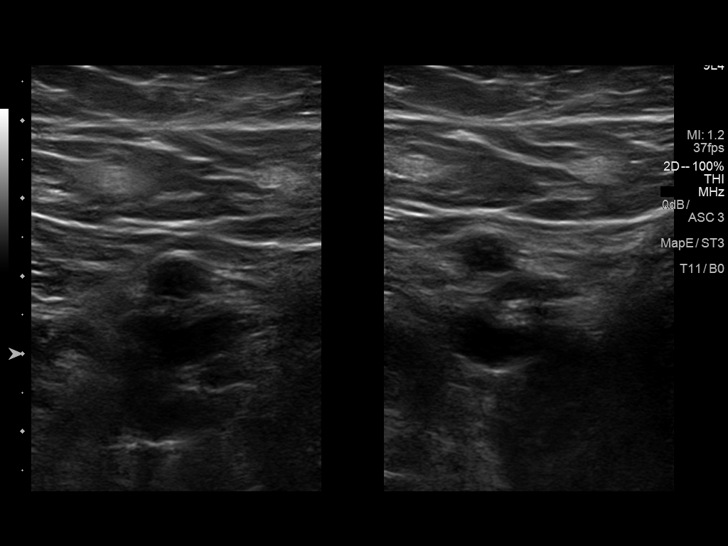
[im 14/78]
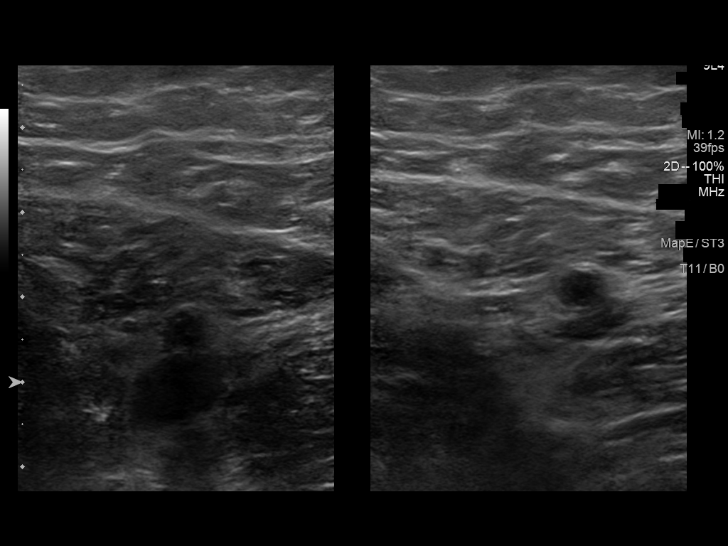
[im 21/78]
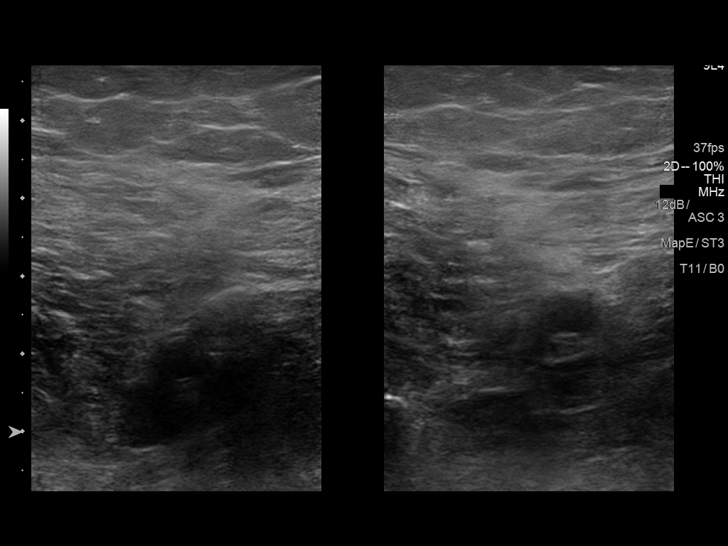
[im 27/78]
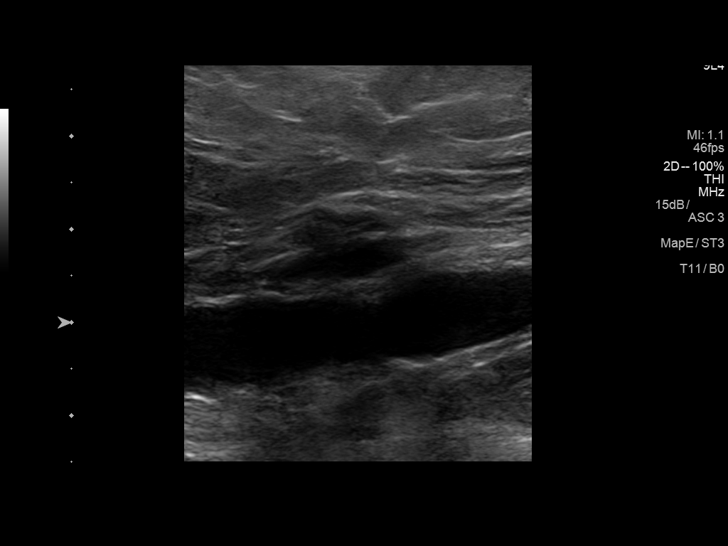
[im 34/78]
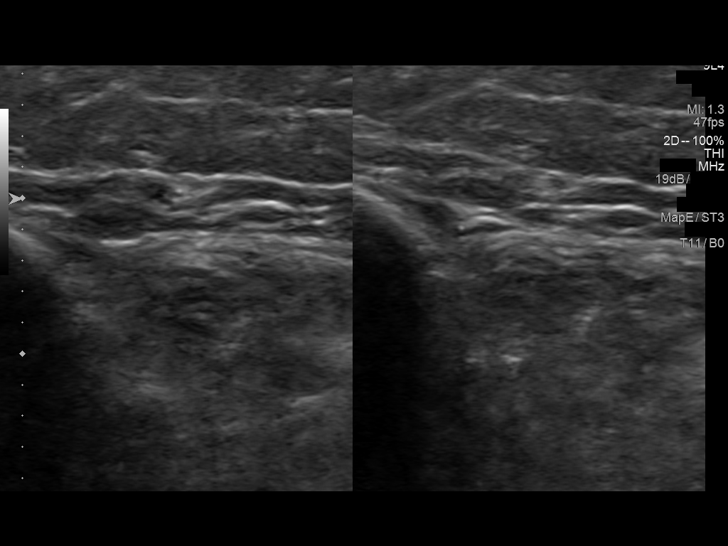
[im 41/78]
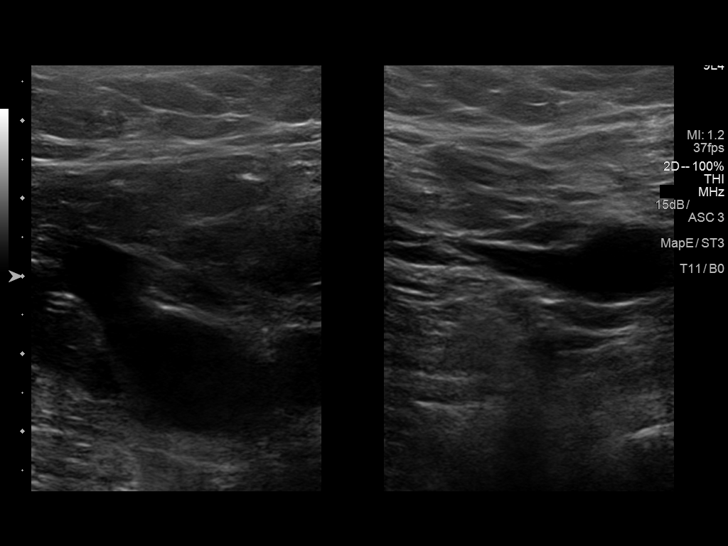
[im 44/78]
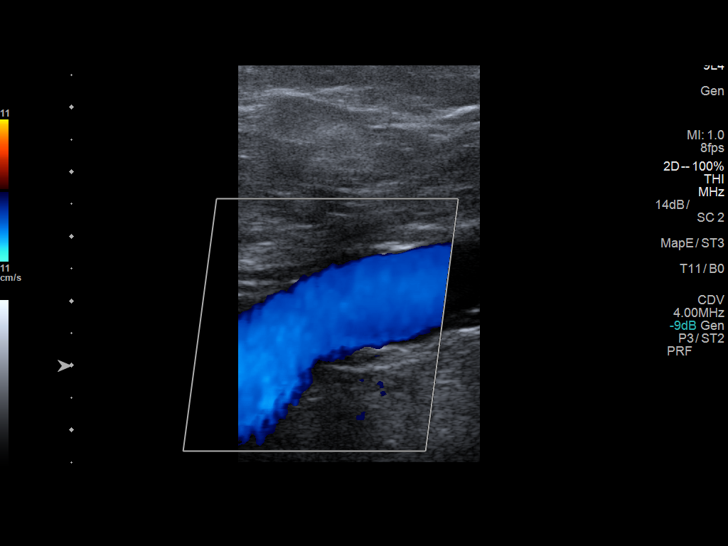
[im 51/78]
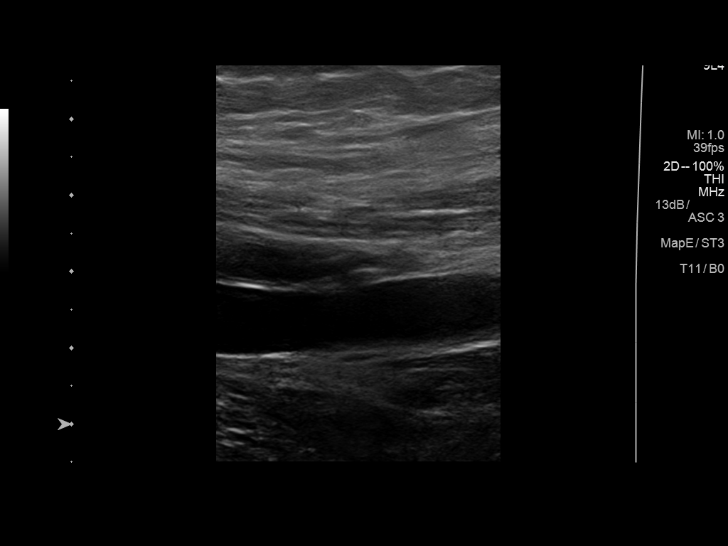
[im 57/78]
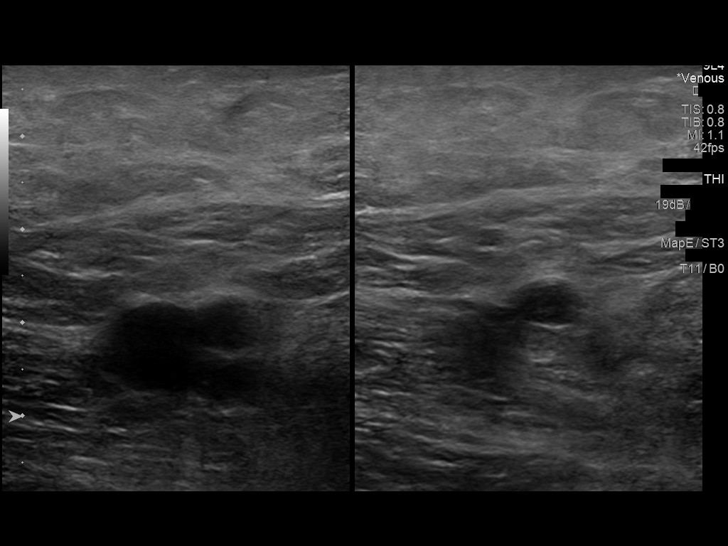
[im 64/78]
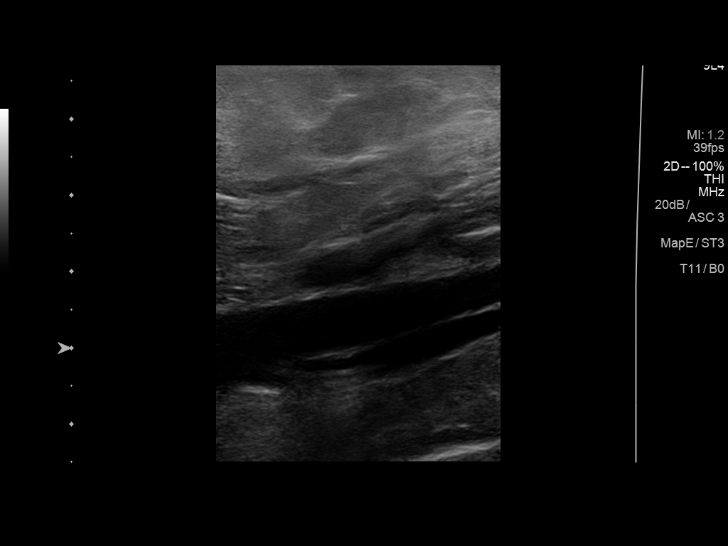
[im 71/78]
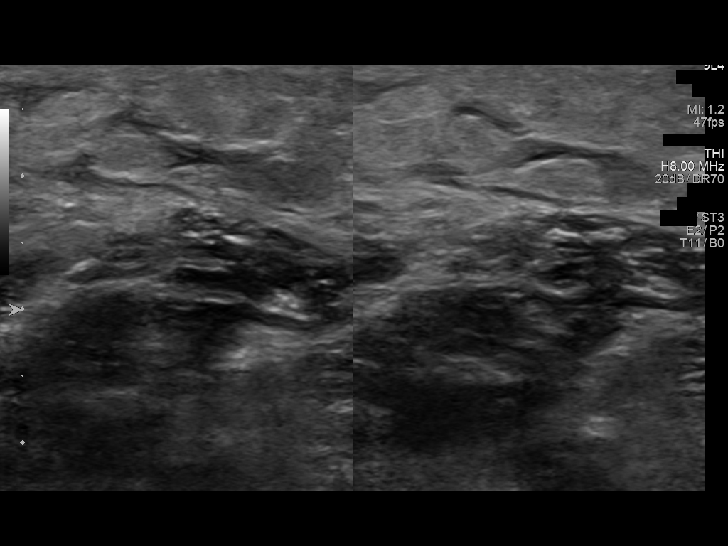
[im 78/78]
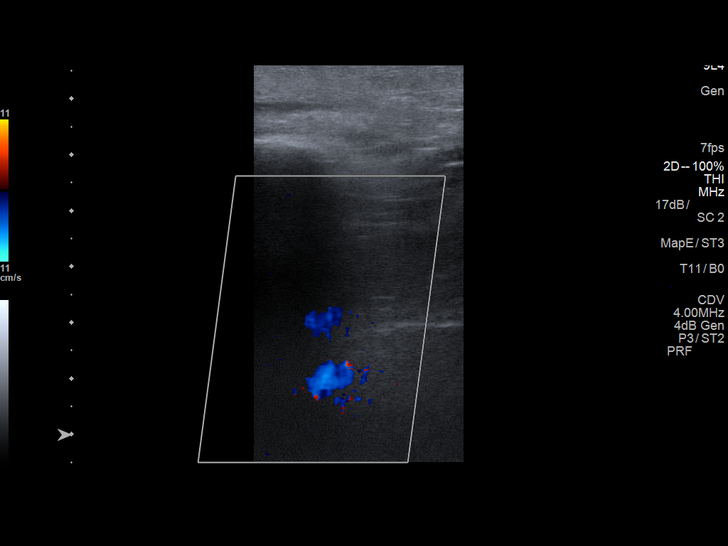

[13 of 24 positions shown; findings below may reference images not displayed]

FINDINGS: RIGHT LOWER EXTREMITY

Common Femoral Vein: No evidence of acute or chronic thrombus.
Normal compressibility, respiratory phasicity and response to
augmentation.

Saphenofemoral Junction: No evidence of thrombus. Normal
compressibility and flow on color Doppler imaging.

Profunda Femoral Vein: No evidence of thrombus. Normal
compressibility and flow on color Doppler imaging.

Femoral Vein: No evidence of thrombus. Normal compressibility,
respiratory phasicity and response to augmentation.

Popliteal Vein: No evidence of thrombus. Normal compressibility,
respiratory phasicity and response to augmentation.

Calf Veins: No evidence of thrombus. Normal compressibility and flow
on color Doppler imaging.

Superficial Great Saphenous Vein: No evidence of thrombus. Normal
compressibility.

Venous Reflux:  None.

Other Findings:  None.

LEFT LOWER EXTREMITY

Common Femoral Vein: No evidence of thrombus. Normal
compressibility, respiratory phasicity and response to augmentation.

Saphenofemoral Junction: No evidence of thrombus. Normal
compressibility and flow on color Doppler imaging.

Profunda Femoral Vein: No evidence of thrombus. Normal
compressibility and flow on color Doppler imaging.

Femoral Vein: No evidence of thrombus. Normal compressibility,
respiratory phasicity and response to augmentation.

Popliteal Vein: No evidence of thrombus. Normal compressibility,
respiratory phasicity and response to augmentation.

Calf Veins: No evidence of thrombus. Normal compressibility and flow
on color Doppler imaging.

Superficial Great Saphenous Vein: No evidence of thrombus. Normal
compressibility.

Venous Reflux:  None.

Other Findings: There is a minimal amount of subcutaneous edema
about the left calf.
IMPRESSION: No evidence of acute or chronic DVT within either lower extremity
with special attention paid to the right common femoral vein.

## 2020-06-21 ENCOUNTER — Other Ambulatory Visit (HOSPITAL_COMMUNITY): Payer: Self-pay | Admitting: Family Medicine

## 2020-06-21 DIAGNOSIS — R55 Syncope and collapse: Secondary | ICD-10-CM

## 2022-06-04 ENCOUNTER — Other Ambulatory Visit (HOSPITAL_COMMUNITY): Payer: Self-pay | Admitting: Family Medicine

## 2022-06-04 DIAGNOSIS — R600 Localized edema: Secondary | ICD-10-CM

## 2022-06-11 ENCOUNTER — Ambulatory Visit (HOSPITAL_COMMUNITY)
Admission: RE | Admit: 2022-06-11 | Discharge: 2022-06-11 | Disposition: A | Discharge status: Home / Self Care | Payer: Medicare Other | Source: Ambulatory Visit | Attending: Internal Medicine | Admitting: Internal Medicine

## 2022-06-11 DIAGNOSIS — R600 Localized edema: Secondary | ICD-10-CM | POA: Insufficient documentation

## 2022-11-06 ENCOUNTER — Other Ambulatory Visit: Payer: Self-pay | Admitting: Family Medicine

## 2022-11-06 DIAGNOSIS — Z1231 Encounter for screening mammogram for malignant neoplasm of breast: Secondary | ICD-10-CM

## 2022-11-27 ENCOUNTER — Ambulatory Visit
Admission: RE | Admit: 2022-11-27 | Discharge: 2022-11-27 | Disposition: A | Discharge status: Home / Self Care | Payer: Medicare Other | Source: Ambulatory Visit | Attending: Family Medicine | Admitting: Family Medicine

## 2022-11-27 DIAGNOSIS — Z1231 Encounter for screening mammogram for malignant neoplasm of breast: Secondary | ICD-10-CM | POA: Insufficient documentation

## 2022-12-03 ENCOUNTER — Other Ambulatory Visit: Payer: Self-pay | Admitting: *Deleted

## 2022-12-03 ENCOUNTER — Inpatient Hospital Stay
Admission: RE | Admit: 2022-12-03 | Discharge: 2022-12-03 | Disposition: A | Discharge status: Home / Self Care | Payer: Self-pay | Source: Ambulatory Visit | Attending: *Deleted | Admitting: *Deleted

## 2022-12-03 DIAGNOSIS — Z1231 Encounter for screening mammogram for malignant neoplasm of breast: Secondary | ICD-10-CM

## 2023-08-04 ENCOUNTER — Other Ambulatory Visit: Payer: Self-pay | Admitting: Family Medicine

## 2023-08-04 DIAGNOSIS — K859 Acute pancreatitis without necrosis or infection, unspecified: Secondary | ICD-10-CM

## 2023-08-12 ENCOUNTER — Other Ambulatory Visit: Payer: Self-pay | Admitting: Family Medicine

## 2023-08-12 ENCOUNTER — Ambulatory Visit
Admission: RE | Admit: 2023-08-12 | Discharge: 2023-08-12 | Disposition: A | Discharge status: Home / Self Care | Payer: BC Managed Care – PPO | Source: Ambulatory Visit | Attending: Family Medicine | Admitting: Family Medicine

## 2023-08-12 DIAGNOSIS — M549 Dorsalgia, unspecified: Secondary | ICD-10-CM

## 2023-08-12 DIAGNOSIS — K859 Acute pancreatitis without necrosis or infection, unspecified: Secondary | ICD-10-CM

## 2023-10-23 ENCOUNTER — Other Ambulatory Visit (HOSPITAL_COMMUNITY): Payer: Self-pay

## 2023-10-23 ENCOUNTER — Other Ambulatory Visit (HOSPITAL_BASED_OUTPATIENT_CLINIC_OR_DEPARTMENT_OTHER): Payer: Self-pay

## 2023-10-23 MED ORDER — BISMUTH/METRONIDAZ/TETRACYCLIN 140-125-125 MG PO CAPS
3.0000 | ORAL_CAPSULE | Freq: Four times a day (QID) | ORAL | 0 refills | Status: AC
  Filled 2023-10-23: qty 120, 10d supply, fill #0

## 2023-12-02 ENCOUNTER — Other Ambulatory Visit (HOSPITAL_COMMUNITY): Payer: Self-pay

## 2024-05-23 ENCOUNTER — Other Ambulatory Visit: Payer: Self-pay | Admitting: Family Medicine

## 2024-05-23 DIAGNOSIS — Z1231 Encounter for screening mammogram for malignant neoplasm of breast: Secondary | ICD-10-CM

## 2024-06-08 ENCOUNTER — Ambulatory Visit
Admission: RE | Admit: 2024-06-08 | Discharge: 2024-06-08 | Disposition: A | Discharge status: Home / Self Care | Source: Ambulatory Visit | Attending: Family Medicine | Admitting: Family Medicine

## 2024-06-08 DIAGNOSIS — Z1231 Encounter for screening mammogram for malignant neoplasm of breast: Secondary | ICD-10-CM | POA: Diagnosis present
